# Patient Record
Sex: Female | Born: 1980 | Race: White | Hispanic: No | Marital: Single | Smoking: Current some day smoker
Health system: Southern US, Community
[De-identification: ages and names within clinical notes are randomized; demographics above are authoritative.]

## PROBLEM LIST (undated history)

## (undated) DIAGNOSIS — F329 Major depressive disorder, single episode, unspecified: Secondary | ICD-10-CM

## (undated) DIAGNOSIS — Z8619 Personal history of other infectious and parasitic diseases: Secondary | ICD-10-CM

## (undated) DIAGNOSIS — IMO0002 Reserved for concepts with insufficient information to code with codable children: Secondary | ICD-10-CM

## (undated) DIAGNOSIS — T7840XA Allergy, unspecified, initial encounter: Secondary | ICD-10-CM

## (undated) DIAGNOSIS — J45909 Unspecified asthma, uncomplicated: Secondary | ICD-10-CM

## (undated) DIAGNOSIS — F32A Depression, unspecified: Secondary | ICD-10-CM

## (undated) HISTORY — DX: Personal history of other infectious and parasitic diseases: Z86.19

## (undated) HISTORY — PX: CHOLECYSTECTOMY: SHX55

## (undated) HISTORY — DX: Depression, unspecified: F32.A

## (undated) HISTORY — DX: Reserved for concepts with insufficient information to code with codable children: IMO0002

## (undated) HISTORY — DX: Unspecified asthma, uncomplicated: J45.909

## (undated) HISTORY — DX: Major depressive disorder, single episode, unspecified: F32.9

## (undated) HISTORY — DX: Allergy, unspecified, initial encounter: T78.40XA

---

## 2004-07-31 ENCOUNTER — Emergency Department: Payer: Self-pay | Admitting: Emergency Medicine

## 2004-10-12 HISTORY — PX: GALLBLADDER SURGERY: SHX652

## 2005-10-12 HISTORY — PX: TUBAL LIGATION: SHX77

## 2005-11-10 ENCOUNTER — Inpatient Hospital Stay: Payer: Self-pay | Admitting: Obstetrics & Gynecology

## 2006-07-01 ENCOUNTER — Ambulatory Visit: Payer: Self-pay | Admitting: Chiropractic Medicine

## 2006-07-12 ENCOUNTER — Ambulatory Visit: Payer: Self-pay | Admitting: Chiropractic Medicine

## 2006-09-08 ENCOUNTER — Ambulatory Visit: Payer: Self-pay | Admitting: Internal Medicine

## 2006-10-25 ENCOUNTER — Emergency Department: Payer: Self-pay | Admitting: Unknown Physician Specialty

## 2007-10-13 DIAGNOSIS — IMO0002 Reserved for concepts with insufficient information to code with codable children: Secondary | ICD-10-CM

## 2007-10-13 HISTORY — DX: Reserved for concepts with insufficient information to code with codable children: IMO0002

## 2008-04-06 ENCOUNTER — Emergency Department: Payer: Self-pay | Admitting: Emergency Medicine

## 2008-07-06 ENCOUNTER — Inpatient Hospital Stay: Payer: Self-pay | Admitting: Psychiatry

## 2009-09-11 ENCOUNTER — Emergency Department: Payer: Self-pay | Admitting: Emergency Medicine

## 2010-02-25 ENCOUNTER — Emergency Department: Payer: Self-pay | Admitting: Emergency Medicine

## 2010-11-05 ENCOUNTER — Emergency Department: Payer: Self-pay | Admitting: Emergency Medicine

## 2011-03-30 ENCOUNTER — Emergency Department: Payer: Self-pay | Admitting: Emergency Medicine

## 2011-09-18 ENCOUNTER — Emergency Department: Payer: Self-pay | Admitting: Emergency Medicine

## 2012-05-26 ENCOUNTER — Emergency Department: Payer: Self-pay | Admitting: Emergency Medicine

## 2012-08-16 ENCOUNTER — Emergency Department: Payer: Self-pay | Admitting: Emergency Medicine

## 2012-11-28 ENCOUNTER — Emergency Department: Payer: Self-pay | Admitting: Emergency Medicine

## 2012-11-30 LAB — BETA STREP CULTURE(ARMC)

## 2014-12-16 ENCOUNTER — Emergency Department: Payer: Self-pay | Admitting: Emergency Medicine

## 2015-04-19 ENCOUNTER — Encounter: Payer: Self-pay | Admitting: Family Medicine

## 2015-04-19 ENCOUNTER — Other Ambulatory Visit: Payer: Self-pay

## 2015-04-19 ENCOUNTER — Ambulatory Visit (INDEPENDENT_AMBULATORY_CARE_PROVIDER_SITE_OTHER): Payer: Medicaid Other | Admitting: Family Medicine

## 2015-04-19 VITALS — BP 115/73 | HR 83 | Temp 99.0°F | Resp 16 | Ht 63.0 in | Wt 129.4 lb

## 2015-04-19 DIAGNOSIS — M1732 Unilateral post-traumatic osteoarthritis, left knee: Secondary | ICD-10-CM

## 2015-04-19 DIAGNOSIS — J4530 Mild persistent asthma, uncomplicated: Secondary | ICD-10-CM | POA: Insufficient documentation

## 2015-04-19 DIAGNOSIS — Z8342 Family history of familial hypercholesterolemia: Secondary | ICD-10-CM

## 2015-04-19 DIAGNOSIS — F419 Anxiety disorder, unspecified: Secondary | ICD-10-CM

## 2015-04-19 DIAGNOSIS — Z8349 Family history of other endocrine, nutritional and metabolic diseases: Secondary | ICD-10-CM | POA: Diagnosis not present

## 2015-04-19 DIAGNOSIS — F32A Depression, unspecified: Secondary | ICD-10-CM

## 2015-04-19 DIAGNOSIS — F418 Other specified anxiety disorders: Secondary | ICD-10-CM | POA: Diagnosis not present

## 2015-04-19 DIAGNOSIS — Z720 Tobacco use: Secondary | ICD-10-CM

## 2015-04-19 DIAGNOSIS — F329 Major depressive disorder, single episode, unspecified: Secondary | ICD-10-CM | POA: Diagnosis not present

## 2015-04-19 DIAGNOSIS — M1712 Unilateral primary osteoarthritis, left knee: Secondary | ICD-10-CM | POA: Insufficient documentation

## 2015-04-19 DIAGNOSIS — J302 Other seasonal allergic rhinitis: Secondary | ICD-10-CM

## 2015-04-19 MED ORDER — MELOXICAM 15 MG PO TABS: 15.0000 mg | ORAL_TABLET | Freq: Every day | ORAL | Status: AC

## 2015-04-19 MED ORDER — FLUTICASONE PROPIONATE HFA 110 MCG/ACT IN AERO: 2.0000 | INHALATION_SPRAY | Freq: Two times a day (BID) | RESPIRATORY_TRACT | Status: DC

## 2015-04-19 MED ORDER — FLUTICASONE PROPIONATE 50 MCG/ACT NA SUSP: 2.0000 | Freq: Every day | NASAL | Status: DC

## 2015-04-19 MED ORDER — ESCITALOPRAM OXALATE 10 MG PO TABS: 10.0000 mg | ORAL_TABLET | Freq: Every day | ORAL | Status: DC

## 2015-04-19 MED ORDER — ALBUTEROL SULFATE HFA 108 (90 BASE) MCG/ACT IN AERS: 2.0000 | INHALATION_SPRAY | Freq: Four times a day (QID) | RESPIRATORY_TRACT | Status: AC | PRN

## 2015-04-19 MED ORDER — CETIRIZINE HCL 10 MG PO TABS: 10.0000 mg | ORAL_TABLET | Freq: Every day | ORAL | Status: DC
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
## 2015-04-19 NOTE — Assessment & Plan Note (Signed)
Pt is not ready to quit at this time.  
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
## 2015-04-19 NOTE — Patient Instructions (Signed)
Knee: We will refer you to orthopedics and start Mobic for pain/swelling today. Please do not take any addition ibuprofen, aleve, or Goody's powder while taking this medication.   Asthma: Start controller medication 2 puffs twice daily, and use albuterol inhaler as needed. You will need spirometry in the near future to check your lungs.  Please seek immediate medical attention if you develop shortness of breath not relieve by inhaler, chest pain/tightness, fever > 103 F or other concerning symptoms.

## 2015-04-19 NOTE — Assessment & Plan Note (Signed)
Symptoms consistent with persistent asthma. Start controller medication today. PRN albuterol inhaler. Asthma alarm symptoms reviewed. Spirometry at next visit to determine asthma severity or pt report of possible COPD. Smoking cessation advised.
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
## 2015-04-19 NOTE — Assessment & Plan Note (Signed)
Start zyrtec and flonase for allergies.

## 2015-04-27 LAB — CBC WITH DIFFERENTIAL/PLATELET
BASOS ABS: 0 10*3/uL (ref 0.0–0.2)
BASOS: 1 %
EOS (ABSOLUTE): 0.1 10*3/uL (ref 0.0–0.4)
Eos: 2 %
HEMATOCRIT: 35.9 % (ref 34.0–46.6)
HEMOGLOBIN: 12.3 g/dL (ref 11.1–15.9)
Immature Grans (Abs): 0 10*3/uL (ref 0.0–0.1)
Immature Granulocytes: 0 %
Lymphocytes Absolute: 1.8 10*3/uL (ref 0.7–3.1)
Lymphs: 31 %
MCH: 34.4 pg — ABNORMAL HIGH (ref 26.6–33.0)
MCHC: 34.3 g/dL (ref 31.5–35.7)
MCV: 100 fL — ABNORMAL HIGH (ref 79–97)
MONOS ABS: 0.4 10*3/uL (ref 0.1–0.9)
Monocytes: 7 %
NEUTROS ABS: 3.5 10*3/uL (ref 1.4–7.0)
Neutrophils: 59 %
Platelets: 216 10*3/uL (ref 150–379)
RBC: 3.58 x10E6/uL — ABNORMAL LOW (ref 3.77–5.28)
RDW: 13.3 % (ref 12.3–15.4)
WBC: 5.9 10*3/uL (ref 3.4–10.8)

## 2015-04-27 LAB — COMPREHENSIVE METABOLIC PANEL
ALBUMIN: 4.5 g/dL (ref 3.5–5.5)
ALT: 8 IU/L (ref 0–32)
AST: 13 IU/L (ref 0–40)
Albumin/Globulin Ratio: 2.5 (ref 1.1–2.5)
Alkaline Phosphatase: 62 IU/L (ref 39–117)
BUN/Creatinine Ratio: 25 — ABNORMAL HIGH (ref 8–20)
BUN: 16 mg/dL (ref 6–20)
Bilirubin Total: 0.3 mg/dL (ref 0.0–1.2)
CO2: 23 mmol/L (ref 18–29)
CREATININE: 0.63 mg/dL (ref 0.57–1.00)
Calcium: 9.4 mg/dL (ref 8.7–10.2)
Chloride: 100 mmol/L (ref 97–108)
GFR, EST AFRICAN AMERICAN: 134 mL/min/{1.73_m2} (ref >59)
GFR, EST NON AFRICAN AMERICAN: 117 mL/min/{1.73_m2} (ref >59)
GLUCOSE: 100 mg/dL — AB (ref 65–99)
Globulin, Total: 1.8 g/dL (ref 1.5–4.5)
Potassium: 4.5 mmol/L (ref 3.5–5.2)
Sodium: 140 mmol/L (ref 134–144)
Total Protein: 6.3 g/dL (ref 6.0–8.5)

## 2015-04-27 LAB — LIPID PANEL
CHOLESTEROL TOTAL: 137 mg/dL (ref 100–199)
Chol/HDL Ratio: 1.8 ratio units (ref 0.0–4.4)
HDL: 77 mg/dL (ref >39)
LDL Calculated: 40 mg/dL (ref 0–99)
Triglycerides: 98 mg/dL (ref 0–149)
VLDL CHOLESTEROL CAL: 20 mg/dL (ref 5–40)

## 2015-04-29 ENCOUNTER — Telehealth: Payer: Self-pay | Admitting: Family Medicine

## 2015-04-29 NOTE — Telephone Encounter (Signed)
Called [facility] to add on B12 and folate to work up macrocytic anemia shown on last CBC.

## 2015-04-29 NOTE — Telephone Encounter (Signed)
Called pt to review labs results, LMTCB:  CMP is normal but glucose is elevated- will do a quick screen for diabetes at her next visit.  CBC: Shows low RBC and large cells. Have added on some lab tests to work-up for anemia.  Lipid panel is normal.

## 2015-04-30 LAB — B12 AND FOLATE PANEL
FOLATE: 15.7 ng/mL (ref >3.0)
VITAMIN B 12: 1562 pg/mL — AB (ref 211–946)

## 2015-04-30 NOTE — Telephone Encounter (Signed)
LMTCB

## 2015-04-30 NOTE — Telephone Encounter (Signed)
Pt.notified

## 2015-05-02 LAB — SPECIMEN STATUS REPORT

## 2015-05-17 ENCOUNTER — Encounter: Payer: Self-pay | Admitting: Family Medicine

## 2015-05-17 ENCOUNTER — Ambulatory Visit (INDEPENDENT_AMBULATORY_CARE_PROVIDER_SITE_OTHER): Payer: Medicaid Other | Admitting: Family Medicine

## 2015-05-17 VITALS — BP 115/74 | HR 94 | Temp 98.2°F | Resp 16 | Ht 63.0 in | Wt 129.6 lb

## 2015-05-17 DIAGNOSIS — F419 Anxiety disorder, unspecified: Principal | ICD-10-CM

## 2015-05-17 DIAGNOSIS — Z720 Tobacco use: Secondary | ICD-10-CM | POA: Diagnosis not present

## 2015-05-17 DIAGNOSIS — R5383 Other fatigue: Secondary | ICD-10-CM | POA: Diagnosis not present

## 2015-05-17 DIAGNOSIS — F418 Other specified anxiety disorders: Secondary | ICD-10-CM

## 2015-05-17 DIAGNOSIS — F329 Major depressive disorder, single episode, unspecified: Secondary | ICD-10-CM

## 2015-05-17 DIAGNOSIS — G43909 Migraine, unspecified, not intractable, without status migrainosus: Secondary | ICD-10-CM | POA: Insufficient documentation

## 2015-05-17 DIAGNOSIS — J4530 Mild persistent asthma, uncomplicated: Secondary | ICD-10-CM | POA: Diagnosis not present

## 2015-05-17 DIAGNOSIS — G43709 Chronic migraine without aura, not intractable, without status migrainosus: Secondary | ICD-10-CM

## 2015-05-17 DIAGNOSIS — R7309 Other abnormal glucose: Secondary | ICD-10-CM

## 2015-05-17 DIAGNOSIS — R748 Abnormal levels of other serum enzymes: Secondary | ICD-10-CM | POA: Diagnosis not present

## 2015-05-17 LAB — POCT GLYCOSYLATED HEMOGLOBIN (HGB A1C): Hemoglobin A1C: 4.8

## 2015-05-17 MED ORDER — SUMATRIPTAN SUCCINATE 50 MG PO TABS: 50.0000 mg | ORAL_TABLET | ORAL | Status: DC | PRN

## 2015-05-17 MED ORDER — MONTELUKAST SODIUM 10 MG PO TABS: 10.0000 mg | ORAL_TABLET | Freq: Every day | ORAL | Status: DC
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
## 2015-05-17 NOTE — Addendum Note (Signed)
Addended byLaroy Apple, Decorian Schuenemann L on: 05/17/2015 03:11 PM   Modules accepted: Orders

## 2015-05-17 NOTE — Assessment & Plan Note (Signed)
Recommend keeping HA diary. Trial of triptans PRN for relief- recommend no more than 2-3x per month.  Reduce caffeine.  Consider prophylaxis if conservative measures are not helpful.

## 2015-05-17 NOTE — Assessment & Plan Note (Signed)
Encouraged smoking cessation. Discussed it role in asthma. Commended pt on reducing cigarettes.  [place] [facility] reviewed.
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
## 2015-05-17 NOTE — Assessment & Plan Note (Signed)
Trial of singulair to help reduce symptoms. Encouraged smoking cessation to help reduce symptoms.  Spirometry is due but patient has declined referral for outpatient spirometry at hospital. Will do office spirometry in November when equipment is operational.

## 2015-05-17 NOTE — Patient Instructions (Addendum)
Migraines: You can take 1 imitrex as needed for migraine. If it doesn't help, you can take another 2 hours later. Please stop taking B12 and do not drink energy drinks that contain B12. To help with your energy you can take a Vitamin D3 2000IU OTC.   To help your headaches try limiting your caffeine.    Migraine Headache A migraine headache is an intense, throbbing pain on one or both sides of your head. A migraine can last for 30 minutes to several hours. CAUSES  The exact cause of a migraine headache is not always known. However, a migraine may be caused when nerves in the brain become irritated and release chemicals that cause inflammation. This causes pain. Certain things may also trigger migraines, such as:  Alcohol.  Smoking.  Stress.  Menstruation.  Aged cheeses.  Foods or drinks that contain nitrates, glutamate, aspartame, or tyramine.  Lack of sleep.  Chocolate.  Caffeine.  Hunger.  Physical exertion.  Fatigue.  Medicines used to treat chest pain (nitroglycerine), birth control pills, estrogen, and some blood pressure medicines. SIGNS AND SYMPTOMS  Pain on one or both sides of your head.  Pulsating or throbbing pain.  Severe pain that prevents daily activities.  Pain that is aggravated by any physical activity.  Nausea, vomiting, or both.  Dizziness.  Pain with exposure to bright lights, loud noises, or activity.  General sensitivity to bright lights, loud noises, or smells. Before you get a migraine, you may get warning signs that a migraine is coming (aura). An aura may include:  Seeing flashing lights.  Seeing bright spots, halos, or zigzag lines.  Having tunnel vision or blurred vision.  Having feelings of numbness or tingling.  Having trouble talking.  Having muscle weakness. DIAGNOSIS  A migraine headache is often diagnosed based on:  Symptoms.  Physical exam.  A CT scan or MRI of your head. These imaging tests cannot diagnose  migraines, but they can help rule out other causes of headaches. TREATMENT Medicines may be given for pain and nausea. Medicines can also be given to help prevent recurrent migraines.  HOME CARE INSTRUCTIONS  Only take over-the-counter or prescription medicines for pain or discomfort as directed by your health care provider. The use of long-term narcotics is not recommended.  Lie down in a dark, quiet room when you have a migraine.  Keep a journal to find out what may trigger your migraine headaches. For example, write down:  What you eat and drink.  How much sleep you get.  Any change to your diet or medicines.  Limit alcohol consumption.  Quit smoking if you smoke.  Get 7-9 hours of sleep, or as recommended by your health care provider.  Limit stress.  Keep lights dim if bright lights bother you and make your migraines worse. SEEK IMMEDIATE MEDICAL CARE IF:   Your migraine becomes severe.  You have a fever.  You have a stiff neck.  You have vision loss.  You have muscular weakness or loss of muscle control.  You start losing your balance or have trouble walking.  You feel faint or pass out.  You have severe symptoms that are different from your first symptoms. MAKE SURE YOU:   Understand these instructions.  Will watch your condition.  Will get help right away if you are not doing well or get worse. Document Released: 09/28/2005 Document Revised: 02/12/2014 Document Reviewed: 06/05/2013 [facility] Patient Information 2015 [place], [place]. This information is not intended to replace advice given  to you by your health care provider. Make sure you discuss any questions you have with your health care provider.  

## 2015-08-30 ENCOUNTER — Encounter: Payer: Medicaid Other | Admitting: Family Medicine

## 2015-09-27 ENCOUNTER — Encounter: Payer: Medicaid Other | Admitting: Family Medicine

## 2016-01-10 ENCOUNTER — Encounter: Payer: Self-pay | Admitting: Family Medicine

## 2016-01-10 ENCOUNTER — Ambulatory Visit (INDEPENDENT_AMBULATORY_CARE_PROVIDER_SITE_OTHER): Payer: Medicaid Other | Admitting: Family Medicine

## 2016-01-10 VITALS — BP 128/76 | HR 72 | Temp 98.2°F | Resp 16 | Ht 62.0 in | Wt 112.8 lb

## 2016-01-10 DIAGNOSIS — J0100 Acute maxillary sinusitis, unspecified: Secondary | ICD-10-CM | POA: Diagnosis not present

## 2016-01-10 DIAGNOSIS — J302 Other seasonal allergic rhinitis: Secondary | ICD-10-CM | POA: Diagnosis not present

## 2016-01-10 DIAGNOSIS — R630 Anorexia: Secondary | ICD-10-CM

## 2016-01-10 DIAGNOSIS — J4531 Mild persistent asthma with (acute) exacerbation: Secondary | ICD-10-CM | POA: Diagnosis not present

## 2016-01-10 MED ORDER — PREDNISONE 20 MG PO TABS: 40.0000 mg | ORAL_TABLET | Freq: Every day | ORAL | Status: DC

## 2016-01-10 MED ORDER — DOXYCYCLINE HYCLATE 100 MG PO TABS: 100.0000 mg | ORAL_TABLET | Freq: Two times a day (BID) | ORAL | Status: DC

## 2016-01-10 MED ORDER — DM-GUAIFENESIN ER 30-600 MG PO TB12: 1.0000 | ORAL_TABLET | Freq: Two times a day (BID) | ORAL | Status: DC

## 2016-01-10 MED ORDER — PSEUDOEPHEDRINE HCL 60 MG PO TABS: 60.0000 mg | ORAL_TABLET | Freq: Three times a day (TID) | ORAL | Status: DC | PRN

## 2016-01-10 NOTE — Progress Notes (Signed)
Subjective:    Patient ID: Kathy Quinn Diggs, female    DOB: 10/22/1980, 35 y.o.   MRN: [id number]  HPI: Kathy Quinn Andes is Quinn 35 y.o. female presenting on 01/10/2016 for Sinus Problem   HPI  Pt presents for possible since infection. Symptoms present for about 3 weeks. Facial pain. HA's- pain. Pressure right behind the eyes. Purulent drainage. Post nasal drip and coughing. Feeling nauseated. Poor appetite. Cough is productive- feels like she is using inhaler more frequently. Using inhaler 2-3 times per day. Coughing and wheezing at night. Low grade fevers off and on at home.   Past Medical History  Diagnosis Date  . Allergy   . Asthma   . Depression   . History of chicken pox   . Knee fracture, left 2009    Current Outpatient Prescriptions on File Prior to Visit  Medication Sig  . acetaminophen (TYLENOL) 500 MG tablet Take 500 mg by mouth every 6 (six) hours as needed.  Marland Kitchen. albuterol (PROVENTIL HFA;VENTOLIN HFA) 108 (90 BASE) MCG/ACT inhaler Inhale 2 puffs into the lungs every 6 (six) hours as needed for wheezing or shortness of breath.  . cetirizine (ZYRTEC) 10 MG tablet Take 1 tablet (10 mg total) by mouth daily.  . diphenhydrAMINE (SOMINEX) 25 MG tablet Take 25 mg by mouth at bedtime as needed for sleep.  . ferrous sulfate 325 (65 FE) MG tablet Take 325 mg by mouth daily with breakfast.  . fluticasone (FLONASE) 50 MCG/ACT nasal spray Place 2 sprays into both nostrils daily.  . fluticasone (FLOVENT HFA) 110 MCG/ACT inhaler Inhale 2 puffs into the lungs 2 (two) times daily.  . meloxicam (MOBIC) 15 MG tablet Take 1 tablet (15 mg total) by mouth daily.  . montelukast (SINGULAIR) 10 MG tablet Take 1 tablet (10 mg total) by mouth at bedtime.  . SUMAtriptan (IMITREX) 50 MG tablet Take 1 tablet (50 mg total) by mouth every 2 (two) hours as needed for migraine. May repeat in 2 hours if headache persists or recurs.   No current facility-administered medications on file prior to visit.     Review of Systems  Constitutional: Positive for fever, chills and appetite change.  HENT: Positive for congestion, postnasal drip, sinus pressure and sore throat. Negative for ear pain.   Respiratory: Positive for cough and chest tightness.   Cardiovascular: Negative for chest pain, palpitations and leg swelling.  Gastrointestinal: Positive for nausea.  Neurological: Positive for headaches.   Per HPI unless specifically indicated above     Objective:    BP 128/76 mmHg  Pulse 72  Temp(Src) 98.2 F (36.8 C) (Oral)  Resp 16  Ht 5\' 2"  (1.575 m)  Wt 112 lb 12.8 oz (51.166 kg)  BMI 20.63 kg/m2  SpO2 100%  LMP 12/27/2015 (Approximate)  Wt Readings from Last 3 Encounters:  01/10/16 112 lb 12.8 oz (51.166 kg)  05/17/15 129 lb 9.6 oz (58.786 kg)  04/19/15 129 lb 6.4 oz (58.695 kg)    Physical Exam  Constitutional: She appears well-developed and well-nourished. No distress.  HENT:  Head: Normocephalic and atraumatic.  Right Ear: Hearing and tympanic membrane normal.  Left Ear: Hearing and tympanic membrane normal.  Nose: Mucosal edema and rhinorrhea present. Right sinus exhibits frontal sinus tenderness. Left sinus exhibits frontal sinus tenderness.  Mouth/Throat: Mucous membranes are normal. Posterior oropharyngeal erythema present.  Pulmonary/Chest: Effort normal. No respiratory distress. She has wheezes (expiratory with coughing). She exhibits no tenderness.  Abdominal: Soft. Bowel sounds are normal. There  is no tenderness. There is no CVA tenderness.  Skin: She is not diaphoretic.   Results for orders placed or performed in visit on 05/17/15  POCT HgB A1C  Result Value Ref Range   Hemoglobin A1C 4.8       Assessment & Plan:   Problem List Items Addressed This Visit      Respiratory   Mild persistent asthma - Primary    Treat for exacerbation given wheezing, coughing, and increased SABA use. Prednisone  daily x 5 days. Alarm symptoms reviewed. Recheck on  breathing in 1 mos.       Relevant Medications   predniSONE (DELTASONE) 20 MG tablet   Seasonal allergies    Encouraged pt to use home flonase.       Other Visit Diagnoses    Acute maxillary sinusitis, recurrence not specified        Treat for sinusitis. Doxy BID x 7 days due to cost. Mucinex for congestion.     Relevant Medications    doxycycline (VIBRA-TABS) 100 MG tablet    predniSONE (DELTASONE) 20 MG tablet    dextromethorphan-guaiFENesin (MUCINEX DM) 30-600 MG 12hr tablet    pseudoephedrine (SUDAFED) 60 MG tablet    Poor appetite        Likely 2/2 illness. Will recheck weigth and eating in 1 mos.        Meds ordered this encounter  Medications  . doxycycline (VIBRA-TABS) 100 MG tablet    Sig: Take 1 tablet (100 mg total) by mouth 2 (two) times daily.    Dispense:  14 tablet    Refill:  0    Order Specific Question:  Supervising Provider    Answer:  Janeann Forehand [phone]  . predniSONE (DELTASONE) 20 MG tablet    Sig: Take 2 tablets (40 mg total) by mouth daily with breakfast.    Dispense:  10 tablet    Refill:  0    Order Specific Question:  Supervising Provider    Answer:  Janeann Forehand [phone]  . dextromethorphan-guaiFENesin (MUCINEX DM) 30-600 MG 12hr tablet    Sig: Take 1 tablet by mouth 2 (two) times daily.    Dispense:  20 tablet    Refill:  0    Order Specific Question:  Supervising Provider    Answer:  Janeann Forehand [phone]  . pseudoephedrine (SUDAFED) 60 MG tablet    Sig: Take 1 tablet (60 mg total) by mouth every 8 (eight) hours as needed for congestion.    Dispense:  30 tablet    Refill:  0    Order Specific Question:  Supervising Provider    Answer:  Janeann Forehand [[phone]]      Follow up plan: Return in about 4 weeks (around 02/07/2016) for asthma, weight check.

## 2016-01-10 NOTE — Assessment & Plan Note (Signed)
Encouraged pt to use home flonase.

## 2016-01-10 NOTE — Patient Instructions (Signed)
You can use supportive care at home to help with your symptoms. I have sent Mucinex DM to your pharmacy to help break up the congestion and soothe your cough. You can takes this twice daily.  Honey is a natural cough suppressant- so add it to your tea in the morning.  If you have a humidifer, set that up in your bedroom at night.   Please seek immediate medical attention if you develop shortness of breath not relieve by inhaler, chest pain/tightness, fever > 103 F or other concerning symptoms.    

## 2016-01-10 NOTE — Assessment & Plan Note (Addendum)
Treat for exacerbation given wheezing, coughing, and increased SABA use. Prednisone 40mg  daily x 5 days. Alarm symptoms reviewed. Recheck on breathing in 1 mos.

## 2016-03-11 ENCOUNTER — Emergency Department: Payer: Self-pay

## 2016-03-11 ENCOUNTER — Emergency Department
Admission: EM | Admit: 2016-03-11 | Discharge: 2016-03-11 | Disposition: A | Discharge status: Home / Self Care | Payer: Self-pay | Attending: Emergency Medicine | Admitting: Emergency Medicine

## 2016-03-11 ENCOUNTER — Encounter: Payer: Self-pay | Admitting: Emergency Medicine

## 2016-03-11 DIAGNOSIS — J4530 Mild persistent asthma, uncomplicated: Secondary | ICD-10-CM | POA: Insufficient documentation

## 2016-03-11 DIAGNOSIS — R1031 Right lower quadrant pain: Secondary | ICD-10-CM | POA: Insufficient documentation

## 2016-03-11 DIAGNOSIS — F329 Major depressive disorder, single episode, unspecified: Secondary | ICD-10-CM | POA: Insufficient documentation

## 2016-03-11 DIAGNOSIS — Z79899 Other long term (current) drug therapy: Secondary | ICD-10-CM | POA: Insufficient documentation

## 2016-03-11 DIAGNOSIS — M179 Osteoarthritis of knee, unspecified: Secondary | ICD-10-CM | POA: Insufficient documentation

## 2016-03-11 DIAGNOSIS — Z7951 Long term (current) use of inhaled steroids: Secondary | ICD-10-CM | POA: Insufficient documentation

## 2016-03-11 DIAGNOSIS — Z87891 Personal history of nicotine dependence: Secondary | ICD-10-CM | POA: Insufficient documentation

## 2016-03-11 LAB — COMPREHENSIVE METABOLIC PANEL
ALBUMIN: 4.9 g/dL (ref 3.5–5.0)
ALK PHOS: 87 U/L (ref 38–126)
ALT: 17 U/L (ref 14–54)
AST: 25 U/L (ref 15–41)
Anion gap: 11 (ref 5–15)
BILIRUBIN TOTAL: 0.7 mg/dL (ref 0.3–1.2)
BUN: 13 mg/dL (ref 6–20)
CALCIUM: 9.2 mg/dL (ref 8.9–10.3)
CO2: 25 mmol/L (ref 22–32)
CREATININE: 0.58 mg/dL (ref 0.44–1.00)
Chloride: 103 mmol/L (ref 101–111)
GFR calc Af Amer: >60 mL/min (ref >60)
GFR calc non Af Amer: >60 mL/min (ref >60)
GLUCOSE: 82 mg/dL (ref 65–99)
Potassium: 4 mmol/L (ref 3.5–5.1)
SODIUM: 139 mmol/L (ref 135–145)
TOTAL PROTEIN: 7.6 g/dL (ref 6.5–8.1)

## 2016-03-11 LAB — CHLAMYDIA/NGC RT PCR (ARMC ONLY)
CHLAMYDIA TR: NOT DETECTED
N GONORRHOEAE: NOT DETECTED

## 2016-03-11 LAB — URINALYSIS COMPLETE WITH MICROSCOPIC (ARMC ONLY)
Bacteria, UA: NONE SEEN
Bilirubin Urine: NEGATIVE
GLUCOSE, UA: NEGATIVE mg/dL
Hgb urine dipstick: NEGATIVE
KETONES UR: NEGATIVE mg/dL
Leukocytes, UA: NEGATIVE
NITRITE: NEGATIVE
PROTEIN: NEGATIVE mg/dL
Specific Gravity, Urine: 1.017 (ref 1.005–1.030)
pH: 5 (ref 5.0–8.0)

## 2016-03-11 LAB — CBC
HCT: 43.8 % (ref 35.0–47.0)
HEMOGLOBIN: 15 g/dL (ref 12.0–16.0)
MCH: 34 pg (ref 26.0–34.0)
MCHC: 34.3 g/dL (ref 32.0–36.0)
MCV: 99.1 fL (ref 80.0–100.0)
PLATELETS: 239 10*3/uL (ref 150–440)
RBC: 4.42 MIL/uL (ref 3.80–5.20)
RDW: 13.2 % (ref 11.5–14.5)
WBC: 6.2 10*3/uL (ref 3.6–11.0)

## 2016-03-11 LAB — WET PREP, GENITAL
CLUE CELLS WET PREP: NONE SEEN
Sperm: NONE SEEN
Trich, Wet Prep: NONE SEEN
WBC, Wet Prep HPF POC: NONE SEEN
YEAST WET PREP: NONE SEEN

## 2016-03-11 LAB — HCG, QUANTITATIVE, PREGNANCY: hCG, Beta Chain, Quant, S: <1 m[IU]/mL (ref <5)

## 2016-03-11 LAB — LIPASE, BLOOD: Lipase: 20 U/L (ref 11–51)

## 2016-03-11 MED ORDER — MORPHINE SULFATE (PF) 4 MG/ML IV SOLN
4.0000 mg | Freq: Once | INTRAVENOUS | Status: AC
  Administered 2016-03-11: 4 mg via INTRAVENOUS
  Filled 2016-03-11: qty 1

## 2016-03-11 MED ORDER — ONDANSETRON 4 MG PO TBDP: 4.0000 mg | ORAL_TABLET | Freq: Four times a day (QID) | ORAL | Status: DC | PRN

## 2016-03-11 MED ORDER — SODIUM CHLORIDE 0.9 % IV BOLUS (SEPSIS)
1000.0000 mL | Freq: Once | INTRAVENOUS | Status: AC
  Administered 2016-03-11: 1000 mL via INTRAVENOUS

## 2016-03-11 MED ORDER — IOPAMIDOL (ISOVUE-300) INJECTION 61%
100.0000 mL | Freq: Once | INTRAVENOUS | Status: AC | PRN
  Administered 2016-03-11: 100 mL via INTRAVENOUS

## 2016-03-11 MED ORDER — DIATRIZOATE MEGLUMINE & SODIUM 66-10 % PO SOLN
15.0000 mL | Freq: Once | ORAL | Status: AC
  Administered 2016-03-11: 30 mL via ORAL

## 2016-03-11 MED ORDER — ONDANSETRON HCL 4 MG/2ML IJ SOLN
4.0000 mg | Freq: Once | INTRAMUSCULAR | Status: AC
  Administered 2016-03-11: 4 mg via INTRAVENOUS
  Filled 2016-03-11: qty 2

## 2016-03-11 NOTE — ED Notes (Signed)
Pt woke up Sunday morning "vomiting blood" x1.  Pt states n/v continued but not bloody emesis.  Pt endorses abdominal pain since approx 1 week ago.  Decreased appetite, but able to keep some water down.  Pt in NAD.

## 2016-03-11 NOTE — ED Provider Notes (Signed)
-----------------------------------------   5:44 PM on 03/11/2016 -----------------------------------------   Blood pressure 119/81, pulse 90, temperature 98.4 F (36.9 C), temperature source Oral, resp. rate 18, height 5\' 2"  (1.575 m), weight 115 lb (52.164 kg), last menstrual period 02/24/2016, SpO2 98 %.  Assuming care from Dr. Fanny BienQuale.  In short, Kathy Quinn is a 35 y.o. female with a chief complaint of Nausea; Emesis; and Abdominal Pain .  Refer to the original H&P for additional details.  The current plan of care is to follow the results of the patient's abdominal pelvic CT  Narrative:    CLINICAL DATA: Right lower quadrant pain with nausea and vomiting.  EXAM: CT ABDOMEN AND PELVIS WITH CONTRAST  TECHNIQUE: Multidetector CT imaging of the abdomen and pelvis was performed using the standard protocol following bolus administration of intravenous contrast.  CONTRAST: 100mL ISOVUE-300 IOPAMIDOL (ISOVUE-300) INJECTION 61%  COMPARISON: Ultrasound of the pelvis from earlier today  FINDINGS: There is a 3 mm nodule posteriorly in the right lung on series 4, image 8 of doubtful significance. A 4 mm nodule is seen in the left lung on image 5. The lung bases are otherwise normal. No free air or free fluid. Intra and extrahepatic biliary duct dilatation is likely due to previous cholecystectomy with no filling defects identified in the common bile duct. The liver, portal vein, and adrenal glands are normal. Granulomas are seen in the spleen. The pancreatic duct is borderline measuring 2 mm. No pancreatic mass or peripancreatic stranding is identified. The kidneys are normal. The abdominal aorta is normal in caliber. No adenopathy. The stomach and small bowel are within normal limits. The colon and appendix are normal.  The uterus and left ovary are normal. The right ovary is larger than the left with ring-like enhancement measuring up to 1.8 cm laterally. The bladder is  normal. No adenopathy.  The visualized bones are normal.  IMPRESSION: 1. There is ring enhancement in the right ovary, likely a corpus luteum cyst. 2. The appendix is normal in appearance. No appendicitis identified. 3. Intra and extrahepatic biliary duct dilatation, likely due to previous cholecystectomy. Recommend correlation with labs. 4. Pulmonary nodules as described above with the largest measuring 4 mm on the left. See below for follow-up recommendations. No follow-up needed if patient is low-risk. Non-contrast chest CT can be considered in 12 months if patient is high-risk. This recommendation follows the consensus statement: Guidelines for Management of Incidental Pulmonary Nodules Detected on CT Images:From the Fleischner Society 2017; published online before print (10.1148/[id number]).   Electronically Signed By: Gerome Samavid Williams III M.D On: 03/11/2016 17:34   The patient's results are reviewed with her at the bedside and she does not appear to have a surgical abdomen. We discussed ovarian cyst along with the pulmonary nodule which will need to be rescanned in approximately a year. Patient otherwise feels improved and was advised take over-the-counter pain medication was discharged with prescription for Zofran.  Jennye MoccasinBrian S Blossom Crume, MD 03/11/16 [phone]

## 2016-03-11 NOTE — Discharge Instructions (Signed)
You were seen in the emergency room for abdominal pain. It is important that you follow up closely with your primary care doctor in the next couple of days. I recommend you start taking medication for stomach acid such as over-the-counter Nexium as directed on the packaging. Please follow-up with your primary care doctor as well as gastroenterology.  If you're unable to see her primary care doctor you may return to the emergency room or go to the [facility] in 1 or 2 days for reexam.  Please return to the emergency room right away if you are to develop a fever, severe nausea, your pain becomes severe or worsens, you are unable to keep food down, begin vomiting any dark or bloody fluid, you develop any dark or bloody stools, feel dehydrated, or other new concerns or symptoms arise.   Abdominal Pain, Adult Many things can cause abdominal pain. Usually, abdominal pain is not caused by a disease and will improve without treatment. It can often be observed and treated at home. Your health care provider will do a physical exam and possibly order blood tests and X-rays to help determine the seriousness of your pain. However, in many cases, more time must pass before a clear cause of the pain can be found. Before that point, your health care provider may not know if you need more testing or further treatment. HOME CARE INSTRUCTIONS Monitor your abdominal pain for any changes. The following actions may help to alleviate any discomfort you are experiencing:  Only take over-the-counter or prescription medicines as directed by your health care provider.  Do not take laxatives unless directed to do so by your health care provider.  Try a clear liquid diet (broth, tea, or water) as directed by your health care provider. Slowly move to a bland diet as tolerated. SEEK MEDICAL CARE IF:  You have unexplained abdominal pain.  You have abdominal pain associated with nausea or diarrhea.  You have pain  when you urinate or have a bowel movement.  You experience abdominal pain that wakes you in the night.  You have abdominal pain that is worsened or improved by eating food.  You have abdominal pain that is worsened with eating fatty foods.  You have a fever. SEEK IMMEDIATE MEDICAL CARE IF:  Your pain does not go away within 2 hours.  You keep throwing up (vomiting).  Your pain is felt only in portions of the abdomen, such as the right side or the left lower portion of the abdomen.  You pass bloody or black tarry stools. MAKE SURE YOU:  Understand these instructions.  Will watch your condition.  Will get help right away if you are not doing well or get worse.   This information is not intended to replace advice given to you by your health care provider. Make sure you discuss any questions you have with your health care provider.   Document Released: 07/08/2005 Document Revised: 06/19/2015 Document Reviewed: 06/07/2013 Elsevier Interactive Patient Education 2016 [employer]  Abdominal Pain, Adult Many things can cause abdominal pain. Usually, abdominal pain is not caused by a disease and will improve without treatment. It can often be observed and treated at home. Your health care provider will do a physical exam and possibly order blood tests and X-rays to help determine the seriousness of your pain. However, in many cases, more time must pass before a clear cause of the pain can be found. Before that point, your health care provider may not  know if you need more testing or further treatment. HOME CARE INSTRUCTIONS Monitor your abdominal pain for any changes. The following actions may help to alleviate any discomfort you are experiencing:  Only take over-the-counter or prescription medicines as directed by your health care provider.  Do not take laxatives unless directed to do so by your health care provider.  Try a clear liquid diet (broth, tea, or water) as directed by  your health care provider. Slowly move to a bland diet as tolerated. SEEK MEDICAL CARE IF:  You have unexplained abdominal pain.  You have abdominal pain associated with nausea or diarrhea.  You have pain when you urinate or have a bowel movement.  You experience abdominal pain that wakes you in the night.  You have abdominal pain that is worsened or improved by eating food.  You have abdominal pain that is worsened with eating fatty foods.  You have a fever. SEEK IMMEDIATE MEDICAL CARE IF:  Your pain does not go away within 2 hours.  You keep throwing up (vomiting).  Your pain is felt only in portions of the abdomen, such as the right side or the left lower portion of the abdomen.  You pass bloody or black tarry stools. MAKE SURE YOU:  Understand these instructions.  Will watch your condition.  Will get help right away if you are not doing well or get worse.   This information is not intended to replace advice given to you by your health care provider. Make sure you discuss any questions you have with your health care provider.   Document Released: 07/08/2005 Document Revised: 06/19/2015 Document Reviewed: 06/07/2013 Elsevier Interactive Patient Education [employer]

## 2016-03-11 NOTE — ED Provider Notes (Signed)
[facility] Provider Note  ____________________________________________  Time seen: Approximately 1:24 PM  I have reviewed the triage vital signs and the nursing notes.   HISTORY  Chief Complaint Nausea; Emesis; and Abdominal Pain    HPI Kathy Quinn is a 35 y.o. female reports on Sunday morning she is started developing Quinn right sided abdominal discomfort. She reports that radiates somewhat to her back and is located fairly deep in the right lower abdomen. She woke up and had pain, and she reports that caused her to vomit and that she vomited what she believed was a small amount of blood. She continued to have vomiting a few times through the day, then felt better. She is able to go to work at work yesterday she reports she began having recurrence of fairly severe right lower abdominal pain, again inducing her to vomit about 7 times but no blood in it. Denies any black stool or blood in her stool. She currently reports mild to moderate pain in the right lower abdomen that radiates somewhat towards her right lower back.  She's never had pain like this before. She reports she has been able to eat, but very little for the last day. She reports that she's had Quinn intermittent pain in the right lower abdomen for the last few days at the present time it slightly better, but last night was fairly severe to the point she was throwing up.  She has not had fevers chills chest pain or trouble breathing. She's not had a pain in the right upper abdomen, and reports her gallbladder was removed several years ago. No pain in the left. No vaginal discharge, bleeding. Does report it start to tell if the pains in her abdomen or her lower pelvis.  She's had a previous tubal ligation.   Past Medical History  Diagnosis Date  . Allergy   . Asthma   . Depression   . History of chicken pox   . Knee fracture, left 2009    Patient Active Problem List   Diagnosis Date Noted  . Migraines 05/17/2015  . Anxiety and depression 04/19/2015  . Mild persistent asthma 04/19/2015  . Osteoarthritis of left knee 04/19/2015  . Seasonal allergies 04/19/2015  . Tobacco use 04/19/2015    Past Surgical History  Procedure Laterality Date  . Gallbladder surgery  2006  . Tubal ligation  2007  . Cesarean section  2007    Current Outpatient Rx  Name  Route  Sig  Dispense  Refill  . acetaminophen (TYLENOL) 500 MG tablet   Oral   Take 500 mg by mouth every 6 (six) hours as needed.         Marland Kitchen. albuterol (PROVENTIL HFA;VENTOLIN HFA) 108 (90 BASE) MCG/ACT inhaler   Inhalation   Inhale 2 puffs into the lungs every 6 (six) hours as needed for wheezing or shortness of breath.   1 Inhaler   0   . cetirizine (ZYRTEC) 10 MG tablet   Oral   Take 1 tablet (10 mg total) by mouth daily.   30 tablet   11   . dextromethorphan-guaiFENesin (MUCINEX DM) 30-600 MG 12hr tablet   Oral   Take 1 tablet by mouth 2 (two) times daily.   20 tablet   0   . diphenhydrAMINE (SOMINEX) 25 MG tablet   Oral   Take 25 mg by mouth at bedtime as needed for sleep.         Marland Kitchen. doxycycline (VIBRA-TABS)  100 MG tablet   Oral   Take 1 tablet (100 mg total) by mouth 2 (two) times daily.   14 tablet   0   . ferrous sulfate 325 (65 FE) MG tablet   Oral   Take 325 mg by mouth daily with breakfast.         . fluticasone (FLONASE) 50 MCG/ACT nasal spray   Each Nare   Place 2 sprays into both nostrils daily.   16 g   11   . fluticasone (FLOVENT HFA) 110 MCG/ACT inhaler   Inhalation   Inhale 2 puffs into the lungs 2 (two) times daily.   1 Inhaler   12   . meloxicam (MOBIC) 15 MG tablet   Oral   Take 1 tablet (15 mg total) by mouth daily.   30 tablet   1   . montelukast (SINGULAIR) 10 MG tablet   Oral   Take 1 tablet (10 mg total) by mouth at bedtime.   30 tablet   3   . ondansetron (ZOFRAN ODT) 4 MG disintegrating tablet   Oral   Take 1 tablet (4 mg total)  by mouth every 6 (six) hours as needed for nausea or vomiting.   20 tablet   0   . predniSONE (DELTASONE) 20 MG tablet   Oral   Take 2 tablets (40 mg total) by mouth daily with breakfast.   10 tablet   0   . pseudoephedrine (SUDAFED) 60 MG tablet   Oral   Take 1 tablet (60 mg total) by mouth every 8 (eight) hours as needed for congestion.   30 tablet   0   . SUMAtriptan (IMITREX) 50 MG tablet   Oral   Take 1 tablet (50 mg total) by mouth every 2 (two) hours as needed for migraine. May repeat in 2 hours if headache persists or recurs.   10 tablet   0     Allergies Review of patient's allergies indicates no known allergies.  Family History  Problem Relation Age of Onset  . Depression Mother   . Cancer Mother 40    skin  . Bipolar disorder Maternal Grandmother   . Psychiatric Illness Maternal Grandmother   . Cancer Maternal Grandmother   . Cancer Maternal Grandfather   . Cancer Paternal Grandmother   . Cancer Paternal Grandfather     Social History Social History  Substance Use Topics  . Smoking status: Former Smoker -- 0.00 packs/day for 20 years    Types: E-cigarettes    Quit date: 11/09/2015  . Smokeless tobacco: None  . Alcohol Use: None    Review of Systems Constitutional: No fever/chills Eyes: No visual changes. ENT: No sore throat. Cardiovascular: Denies chest pain. Respiratory: Denies shortness of breath. Gastrointestinal: No diarrhea.  No constipation. All bowel movement yesterday. Genitourinary: Negative for dysuria. Musculoskeletal: Negative for back pain. Skin: Negative for rash. Neurological: Negative for headaches, focal weakness or numbness.  10-point ROS otherwise negative.  ____________________________________________   PHYSICAL EXAM:  VITAL SIGNS: ED Triage Vitals  Enc Vitals Group     BP 03/11/16 1125 129/83 mmHg     Pulse Rate 03/11/16 1125 112     Resp 03/11/16 1125 20     Temp 03/11/16 1125 98.4 F (36.9 C)     Temp  Source 03/11/16 1125 Oral     SpO2 03/11/16 1125 100 %     Weight 03/11/16 1125 115 lb (52.164 kg)     Height 03/11/16 1125 5'  2" (1.575 m)     Head Cir --      Peak Flow --      Pain Score --      Pain Loc --      Pain Edu? --      Excl. in GC? --    Constitutional: Alert and oriented. Well appearing and in no acute distressBut does appear mildly uncomfortable in the bed. Eyes: Conjunctivae are normal. PERRL. EOMI. Head: Atraumatic. Nose: No congestion/rhinnorhea. Mouth/Throat: Mucous membranes are moist.  Oropharynx non-erythematous. Neck: No stridor.   Cardiovascular: Normal rate, regular rhythm. Grossly normal heart sounds.  Good peripheral circulation. Respiratory: Normal respiratory effort.  No retractions. Lungs CTAB. Gastrointestinal: Soft but moderate tenderness primarily located in the right lower quadrant without rebound guarding or frank peritonitis. Pain is located approximately in the region of McBurney's point. . No distention. No abdominal bruits. No CVA tenderness. Negative Rovsing. Negative Murphy. No left-sided abdominal pain. GYN: Escorted by nurse Zollie Scale. Normal external and internal exam. No cervical motion tenderness. No purulence or discharge noted. There is minimal discomfort in the right adnexa without mass noted. No tenderness on the left. Musculoskeletal: No lower extremity tenderness nor edema.  No joint effusions. Neurologic:  Normal speech and language. No gross focal neurologic deficits are appreciated.Skin:  Skin is warm, dry and intact. No rash noted. Psychiatric: Mood and affect are normal. Speech and behavior are normal.  ____________________________________________   LABS (all labs ordered are listed, but only abnormal results are displayed)  Labs Reviewed  URINALYSIS COMPLETEWITH MICROSCOPIC ([facility] ONLY) - Abnormal; Notable for the following:    Color, Urine YELLOW (*)    APPearance CLOUDY (*)    Squamous Epithelial / LPF 6-30 (*)    All other  components within normal limits  WET PREP, GENITAL  CHLAMYDIA/NGC RT PCR ([facility] ONLY)  LIPASE, BLOOD  COMPREHENSIVE METABOLIC PANEL  CBC  HCG, QUANTITATIVE, PREGNANCY   ____________________________________________  EKG   ____________________________________________  RADIOLOGY   US Transvaginal Non-OB (Final result) Result time: 03/11/16 15:17:02   Final result by Rad Results In Interface (03/11/16 15:17:02)   Narrative:   CLINICAL DATA: Right lower quadrant pain intermittently 4 days. Quantitative beta HCG less than 1.  EXAM: TRANSABDOMINAL AND TRANSVAGINAL ULTRASOUND OF PELVIS  DOPPLER ULTRASOUND OF OVARIES  TECHNIQUE: Both transabdominal and transvaginal ultrasound examinations of the pelvis were performed. Transabdominal technique was performed for global imaging of the pelvis including uterus, ovaries, adnexal regions, and pelvic cul-de-sac.  It was necessary to proceed with endovaginal exam following the transabdominal exam to visualize the endometrium and ovaries. Color and duplex Doppler ultrasound was utilized to evaluate blood flow to the ovaries.  COMPARISON: 11/05/2010  FINDINGS: Uterus  Measurements: 3.5 x 4.4 x 9.2 cm. No fibroids or other mass visualized.  Endometrium  Thickness: 4.1 mm. No focal abnormality visualized.  Right ovary  Measurements: 2.1 x 3.8 x 3.2 cm. Normal appearance/no adnexal mass.  Left ovary  Measurements: 1.9 x 2.1 x 3.0 cm. Normal appearance/no adnexal mass.  Pulsed Doppler evaluation of both ovaries demonstrates normal low-resistance arterial and venous waveforms.  Other findings  No abnormal free fluid.  IMPRESSION: Normal pelvic ultrasound.   Electronically Signed By: Elberta Fortis M.D. On: 03/11/2016 15:17          US Pelvis Complete (Final result) Result time: 03/11/16 15:17:02   Final result by Rad Results In Interface (03/11/16 15:17:02)   Narrative:   CLINICAL DATA:  Right lower quadrant pain intermittently 4 days.  Quantitative beta HCG less than 1.  EXAM: TRANSABDOMINAL AND TRANSVAGINAL ULTRASOUND OF PELVIS  DOPPLER ULTRASOUND OF OVARIES  TECHNIQUE: Both transabdominal and transvaginal ultrasound examinations of the pelvis were performed. Transabdominal technique was performed for global imaging of the pelvis including uterus, ovaries, adnexal regions, and pelvic cul-de-sac.  It was necessary to proceed with endovaginal exam following the transabdominal exam to visualize the endometrium and ovaries. Color and duplex Doppler ultrasound was utilized to evaluate blood flow to the ovaries.  COMPARISON: 11/05/2010  FINDINGS: Uterus  Measurements: 3.5 x 4.4 x 9.2 cm. No fibroids or other mass visualized.  Endometrium  Thickness: 4.1 mm. No focal abnormality visualized.  Right ovary  Measurements: 2.1 x 3.8 x 3.2 cm. Normal appearance/no adnexal mass.  Left ovary  Measurements: 1.9 x 2.1 x 3.0 cm. Normal appearance/no adnexal mass.  Pulsed Doppler evaluation of both ovaries demonstrates normal low-resistance arterial and venous waveforms.  Other findings  No abnormal free fluid.  IMPRESSION: Normal pelvic ultrasound.   Electronically Signed By: Elberta Fortis M.D. On: 03/11/2016 15:17          [place] Art/Ven Flow Abd Pelv Doppler (Final result) Result time: 03/11/16 15:17:02   Final result by Rad Results In Interface (03/11/16 15:17:02)   Narrative:   CLINICAL DATA: Right lower quadrant pain intermittently 4 days. Quantitative beta HCG less than 1.  EXAM: TRANSABDOMINAL AND TRANSVAGINAL ULTRASOUND OF PELVIS  DOPPLER ULTRASOUND OF OVARIES  TECHNIQUE: Both transabdominal and transvaginal ultrasound examinations of the pelvis were performed. Transabdominal technique was performed for global imaging of the pelvis including uterus, ovaries, adnexal regions, and pelvic cul-de-sac.  It was necessary to proceed  with endovaginal exam following the transabdominal exam to visualize the endometrium and ovaries. Color and duplex Doppler ultrasound was utilized to evaluate blood flow to the ovaries.  COMPARISON: 11/05/2010  FINDINGS: Uterus  Measurements: 3.5 x 4.4 x 9.2 cm. No fibroids or other mass visualized.  Endometrium  Thickness: 4.1 mm. No focal abnormality visualized.  Right ovary  Measurements: 2.1 x 3.8 x 3.2 cm. Normal appearance/no adnexal mass.  Left ovary  Measurements: 1.9 x 2.1 x 3.0 cm. Normal appearance/no adnexal mass.  Pulsed Doppler evaluation of both ovaries demonstrates normal low-resistance arterial and venous waveforms.  Other findings  No abnormal free fluid.  IMPRESSION: Normal pelvic ultrasound.   Electronically Signed By: Elberta Fortis M.D. On: 03/11/2016 15:17       ____________________________________________   PROCEDURES  Procedure(s) performed: None  Critical Care performed: No  ____________________________________________   INITIAL IMPRESSION / ASSESSMENT AND PLAN / ED COURSE  Pertinent labs & imaging results that were available during my care of the patient were reviewed by me and considered in my medical decision making (see chart for details).    Right lower quadrant abdominal pain, possibly pelvic discomfort off and on for about the last 4 days. The patient did report one episode of blood with emesis, however this has resolved but continued to have vomiting yesterday with associated right-sided pain. Afebrile, in no acute distress, with a relatively reassuring abdominal exam except for Quinn focal tenderness in the right lower abdomen versus pelvis. She is not pregnant, she status post cholecystectomy, her lab work is quite reassuring. However, given the intermittent nature and deep right lower abdominal discomfort we'll proceed with ultrasound to evaluate for ovarian process, exclude torsion, and if no clear etiology noted  there likely proceed with CT to evaluate for possible appendicitis or other acute cause such as renal stone, etc.  -----------------------------------------  4:06 PM on 03/11/2016 -----------------------------------------  Patient reports pain and symptoms improving. She does still have mild discomfort in the right lower abdomen, in given no evidence of gynecologic etiology and associated symptomatology of right lower quadrant pain we'll proceed with CT abdomen to evaluate for appendicitis. Ongoing care and disposition assigned to Dr. Huel Cote, follow up on CT abdomen and pelvis. Disposition likely based on CT findings as patient is felt stable for discharge if no evidence of acute intra-abdominal etiology. She has been able to tolerate oral contrast well, pain is improved and she is resting comfortably at this time. ____________________________________________   FINAL CLINICAL IMPRESSION(S) / ED DIAGNOSES  Final diagnoses:  RLQ abdominal pain      Sharyn Creamer, MD 03/11/16 [phone]

## 2016-03-11 NOTE — ED Notes (Signed)
Right flank pain that began and RLQ pain a few days ago and today has radiated around to back.

## 2016-03-12 LAB — POCT PREGNANCY, URINE: PREG TEST UR: NEGATIVE

## 2016-03-17 ENCOUNTER — Encounter: Payer: Self-pay | Admitting: Family Medicine

## 2016-03-17 ENCOUNTER — Ambulatory Visit: Payer: Medicaid Other | Admitting: Family Medicine

## 2016-05-26 ENCOUNTER — Emergency Department
Admission: EM | Admit: 2016-05-26 | Discharge: 2016-05-27 | Disposition: A | Discharge status: Home / Self Care | Payer: Self-pay | Attending: Emergency Medicine | Admitting: Emergency Medicine

## 2016-05-26 DIAGNOSIS — J45909 Unspecified asthma, uncomplicated: Secondary | ICD-10-CM | POA: Insufficient documentation

## 2016-05-26 DIAGNOSIS — Y999 Unspecified external cause status: Secondary | ICD-10-CM | POA: Insufficient documentation

## 2016-05-26 DIAGNOSIS — S61411A Laceration without foreign body of right hand, initial encounter: Secondary | ICD-10-CM

## 2016-05-26 DIAGNOSIS — W228XXA Striking against or struck by other objects, initial encounter: Secondary | ICD-10-CM | POA: Insufficient documentation

## 2016-05-26 DIAGNOSIS — S61511A Laceration without foreign body of right wrist, initial encounter: Secondary | ICD-10-CM | POA: Insufficient documentation

## 2016-05-26 DIAGNOSIS — Z23 Encounter for immunization: Secondary | ICD-10-CM | POA: Insufficient documentation

## 2016-05-26 DIAGNOSIS — Y92002 Bathroom of unspecified non-institutional (private) residence single-family (private) house as the place of occurrence of the external cause: Secondary | ICD-10-CM | POA: Insufficient documentation

## 2016-05-26 DIAGNOSIS — Y939 Activity, unspecified: Secondary | ICD-10-CM | POA: Insufficient documentation

## 2016-05-26 DIAGNOSIS — Z87891 Personal history of nicotine dependence: Secondary | ICD-10-CM | POA: Insufficient documentation

## 2016-05-26 MED ORDER — TETANUS-DIPHTH-ACELL PERTUSSIS 5-2.5-18.5 LF-MCG/0.5 IM SUSP
0.5000 mL | Freq: Once | INTRAMUSCULAR | Status: AC
  Administered 2016-05-26: 0.5 mL via INTRAMUSCULAR
  Filled 2016-05-26: qty 0.5

## 2016-05-26 NOTE — Discharge Instructions (Signed)
Please keep laceration site clean and dry. Follow-up in 7-8 days for suture removal.

## 2016-05-26 NOTE — ED Provider Notes (Signed)
[facility]-EMERGENCY DEPARTMENT Provider Note   CSN: [id number] Arrival date & time: 05/26/16  2255     History   Chief Complaint No chief complaint on file.   HPI Kathy Quinn is a 35 y.o. female presents today for evaluation of laceration to her right hand. Just prior to arrival, patient slipped in the bathroom, hit her right wrist along the countertop. She suffered a laceration to the dorsal aspect of the right wrist. She denies any numbness or tingling. No loss of digit range of motion. She denies any other injury to her body. No head trauma, nausea or vomiting, loss of consciousness. Tetanus is not up-to-date. Pain is moderate. She has not had any medications for pain  HPI    Past Medical History:  Diagnosis Date  . Allergy   . Asthma   . Depression   . History of chicken pox   . Knee fracture, left 2009    Patient Active Problem List   Diagnosis Date Noted  . Migraines 05/17/2015  . Anxiety and depression 04/19/2015  . Mild persistent asthma 04/19/2015  . Osteoarthritis of left knee 04/19/2015  . Seasonal allergies 04/19/2015  . Tobacco use 04/19/2015    Past Surgical History:  Procedure Laterality Date  . CESAREAN SECTION  2007  . GALLBLADDER SURGERY  2006  . TUBAL LIGATION  2007    OB History    No data available       Home Medications    Prior to Admission medications   Medication Sig Start Date End Date Taking? Authorizing Provider  acetaminophen (TYLENOL) 500 MG tablet Take 500 mg by mouth every 6 (six) hours as needed.    Historical Provider, MD  albuterol (PROVENTIL HFA;VENTOLIN HFA) 108 (90 BASE) MCG/ACT inhaler Inhale 2 puffs into the lungs every 6 (six) hours as needed for wheezing or shortness of breath. 04/19/15   Amy Rusty Aus, NP  cetirizine (ZYRTEC) 10 MG tablet Take 1 tablet (10 mg total) by mouth daily. 04/19/15   Amy Rusty Aus, NP  dextromethorphan-guaiFENesin (MUCINEX DM) 30-600 MG 12hr tablet Take 1 tablet by mouth 2 (two) times  daily. 01/10/16   Amy Rusty Aus, NP  diphenhydrAMINE (SOMINEX) 25 MG tablet Take 25 mg by mouth at bedtime as needed for sleep.    Historical Provider, MD  doxycycline (VIBRA-TABS) 100 MG tablet Take 1 tablet (100 mg total) by mouth 2 (two) times daily. 01/10/16   Amy Rusty Aus, NP  ferrous sulfate 325 (65 FE) MG tablet Take 325 mg by mouth daily with breakfast.    Historical Provider, MD  fluticasone (FLONASE) 50 MCG/ACT nasal spray Place 2 sprays into both nostrils daily. 04/19/15   Amy Lauren Krebs, NP  fluticasone (FLOVENT HFA) 110 MCG/ACT inhaler Inhale 2 puffs into the lungs 2 (two) times daily. 04/19/15   Amy Rusty Aus, NP  meloxicam (MOBIC) 15 MG tablet Take 1 tablet (15 mg total) by mouth daily. 04/19/15   Amy Lauren Krebs, NP  montelukast (SINGULAIR) 10 MG tablet Take 1 tablet (10 mg total) by mouth at bedtime. 05/17/15   Amy Rusty Aus, NP  ondansetron (ZOFRAN ODT) 4 MG disintegrating tablet Take 1 tablet (4 mg total) by mouth every 6 (six) hours as needed for nausea or vomiting. 03/11/16   Sharyn Creamer, MD  predniSONE (DELTASONE) 20 MG tablet Take 2 tablets (40 mg total) by mouth daily with breakfast. 01/10/16   Amy Rusty Aus, NP  pseudoephedrine (SUDAFED) 60 MG tablet Take 1 tablet (60  mg total) by mouth every 8 (eight) hours as needed for congestion. 01/10/16   Amy Rusty AusLauren Krebs, NP  SUMAtriptan (IMITREX) 50 MG tablet Take 1 tablet (50 mg total) by mouth every 2 (two) hours as needed for migraine. May repeat in 2 hours if headache persists or recurs. 05/17/15   Amy Rusty AusLauren Krebs, NP    Family History Family History  Problem Relation Age of Onset  . Depression Mother   . Cancer Mother 40    skin  . Bipolar disorder Maternal Grandmother   . Psychiatric Illness Maternal Grandmother   . Cancer Maternal Grandmother   . Cancer Maternal Grandfather   . Cancer Paternal Grandmother   . Cancer Paternal Grandfather     Social History Social History  Substance Use Topics  . Smoking  status: Former Smoker    Packs/day: 0.00    Years: 20.00    Types: E-cigarettes    Quit date: 11/09/2015  . Smokeless tobacco: Not on file  . Alcohol use Not on file     Allergies   Review of patient's allergies indicates no known allergies.   Review of Systems Review of Systems  Constitutional: Negative for activity change, chills, fatigue and fever.  HENT: Negative for congestion, sinus pressure and sore throat.   Eyes: Negative for visual disturbance.  Respiratory: Negative for cough, chest tightness and shortness of breath.   Cardiovascular: Negative for chest pain and leg swelling.  Gastrointestinal: Negative for abdominal pain, diarrhea, nausea and vomiting.  Genitourinary: Negative for dysuria.  Musculoskeletal: Negative for arthralgias and gait problem.  Skin: Positive for wound. Negative for rash.  Neurological: Negative for weakness, numbness and headaches.  Hematological: Negative for adenopathy.  Psychiatric/Behavioral: Negative for agitation, behavioral problems and confusion.     Physical Exam Updated Vital Signs BP 122/75 (BP Location: Left Arm)   Pulse 96   Temp 98 F (36.7 C) (Oral)   Resp 16   Ht 5\' 2"  (1.575 m)   Wt 52.2 kg   SpO2 99%   BMI 21.03 kg/m   Physical Exam  Constitutional: She appears well-developed and well-nourished. No distress.  HENT:  Head: Normocephalic and atraumatic.  Eyes: Conjunctivae are normal.  Neck: Neck supple.  Cardiovascular: Normal rate.   Pulmonary/Chest: Effort normal. No respiratory distress.  Abdominal: There is no tenderness.  Musculoskeletal: She exhibits no edema.  Examination of the wrist shows patient has no soft tissue swelling. She has full range of motion. No ecchymosis or hematoma.  Neurological: She is alert.  Skin: Skin is warm and dry.  2 cm laceration to the dorsal aspect of the right wrist. No tendon deficits noted. Laceration linear no sign of foreign body. No sensation loss.  Psychiatric: She  has a normal mood and affect.  Nursing note and vitals reviewed.    ED Treatments / Results  Labs (all labs ordered are listed, but only abnormal results are displayed) Labs Reviewed - No data to display  EKG  EKG Interpretation None       Radiology No results found.  Procedures Procedures (including critical care time) LACERATION REPAIR Performed by: Patience MuscaGAINES, Helios Kohlmann CHRISTOPHER Authorized by: Patience MuscaGAINES, Nishita Isaacks CHRISTOPHER Consent: Verbal consent obtained. Risks and benefits: risks, benefits and alternatives were discussed Consent given by: patient Patient identity confirmed: provided demographic data Prepped and Draped in normal sterile fashion Wound explored  Laceration Location: Right wrist dorsal  Laceration Length: 2 cm  No Foreign Bodies seen or palpated  Anesthesia: local infiltration  Local anesthetic:  lidocaine 1 % with epinephrine  Anesthetic total: 2 ml  Irrigation method: syringe Amount of cleaning: standard  Skin closure: Simple interrupted   Number of sutures: 4-0 nylon, #3  Technique: Simple interrupted  Patient tolerance: Patient tolerated the procedure well with no immediate complications.  SPLINT APPLICATION Date/Time: 12:04 AM Authorized by: Patience MuscaGAINES, Zyron Deeley CHRISTOPHER Consent: Verbal consent obtained. Risks and benefits: risks, benefits and alternatives were discussed Consent given by: patient Splint applied by: ED tech Location details: Right wrist Velcro  Splint type: Prefabricated Velcro  Supplies used: Prefabricated Velcro  Post-procedure: The splinted body part was neurovascularly unchanged following the procedure. Patient tolerance: Patient tolerated the procedure well with no immediate complications.        Medications Ordered in ED Medications  Tdap (BOOSTRIX) injection 0.5 mL (not administered)     Initial Impression / Assessment and Plan / ED Course  I have reviewed the triage vital signs and the nursing  notes.  Pertinent labs & imaging results that were available during my care of the patient were reviewed by me and considered in my medical decision making (see chart for details).  Clinical Course    35 year old female with right dorsal wrist laceration. No tendon deficits or neurological deficits noted. Laceration. #3 4-0 nylon sutures. Dressing applied, Velcro wrist splint applied. She will follow up in 7 days for suture removal and Steri-Strip application.  Final Clinical Impressions(s) / ED Diagnoses   Final diagnoses:  Hand laceration, right, initial encounter    New Prescriptions New Prescriptions   No medications on file     Evon Slackhomas C Urvi Imes, PA-C 05/27/16 0005    Jennye MoccasinBrian S Quigley, MD 05/27/16 0010

## 2016-05-27 ENCOUNTER — Encounter: Payer: Self-pay | Admitting: *Deleted

## 2016-06-02 ENCOUNTER — Emergency Department
Admission: EM | Admit: 2016-06-02 | Discharge: 2016-06-03 | Disposition: A | Discharge status: Home / Self Care | Payer: Medicaid Other | Attending: Student in an Organized Health Care Education/Training Program | Admitting: Student in an Organized Health Care Education/Training Program

## 2016-06-02 ENCOUNTER — Encounter: Payer: Self-pay | Admitting: Emergency Medicine

## 2016-06-02 DIAGNOSIS — X781XXA Intentional self-harm by knife, initial encounter: Secondary | ICD-10-CM | POA: Insufficient documentation

## 2016-06-02 DIAGNOSIS — S61519A Laceration without foreign body of unspecified wrist, initial encounter: Secondary | ICD-10-CM

## 2016-06-02 DIAGNOSIS — Z5181 Encounter for therapeutic drug level monitoring: Secondary | ICD-10-CM | POA: Insufficient documentation

## 2016-06-02 DIAGNOSIS — F1010 Alcohol abuse, uncomplicated: Secondary | ICD-10-CM

## 2016-06-02 DIAGNOSIS — F1994 Other psychoactive substance use, unspecified with psychoactive substance-induced mood disorder: Secondary | ICD-10-CM

## 2016-06-02 DIAGNOSIS — F10920 Alcohol use, unspecified with intoxication, uncomplicated: Secondary | ICD-10-CM | POA: Insufficient documentation

## 2016-06-02 DIAGNOSIS — Z7289 Other problems related to lifestyle: Secondary | ICD-10-CM

## 2016-06-02 DIAGNOSIS — Z7951 Long term (current) use of inhaled steroids: Secondary | ICD-10-CM | POA: Insufficient documentation

## 2016-06-02 DIAGNOSIS — R45851 Suicidal ideations: Secondary | ICD-10-CM | POA: Insufficient documentation

## 2016-06-02 DIAGNOSIS — F329 Major depressive disorder, single episode, unspecified: Secondary | ICD-10-CM | POA: Insufficient documentation

## 2016-06-02 DIAGNOSIS — F4310 Post-traumatic stress disorder, unspecified: Secondary | ICD-10-CM

## 2016-06-02 DIAGNOSIS — Z87891 Personal history of nicotine dependence: Secondary | ICD-10-CM | POA: Insufficient documentation

## 2016-06-02 DIAGNOSIS — J45909 Unspecified asthma, uncomplicated: Secondary | ICD-10-CM | POA: Insufficient documentation

## 2016-06-02 DIAGNOSIS — F32A Depression, unspecified: Secondary | ICD-10-CM

## 2016-06-02 LAB — COMPREHENSIVE METABOLIC PANEL
ALK PHOS: 88 U/L (ref 38–126)
ALT: 17 U/L (ref 14–54)
ANION GAP: 15 (ref 5–15)
AST: 20 U/L (ref 15–41)
Albumin: 4.9 g/dL (ref 3.5–5.0)
BILIRUBIN TOTAL: 0.4 mg/dL (ref 0.3–1.2)
BUN: 18 mg/dL (ref 6–20)
CALCIUM: 8.9 mg/dL (ref 8.9–10.3)
CO2: 17 mmol/L — AB (ref 22–32)
Chloride: 110 mmol/L (ref 101–111)
Creatinine, Ser: 0.54 mg/dL (ref 0.44–1.00)
Glucose, Bld: 88 mg/dL (ref 65–99)
Potassium: 3.5 mmol/L (ref 3.5–5.1)
SODIUM: 142 mmol/L (ref 135–145)
TOTAL PROTEIN: 7.9 g/dL (ref 6.5–8.1)

## 2016-06-02 LAB — CBC
HCT: 44.4 % (ref 35.0–47.0)
Hemoglobin: 15.6 g/dL (ref 12.0–16.0)
MCH: 34.8 pg — ABNORMAL HIGH (ref 26.0–34.0)
MCHC: 35.1 g/dL (ref 32.0–36.0)
MCV: 99.1 fL (ref 80.0–100.0)
PLATELETS: 254 10*3/uL (ref 150–440)
RBC: 4.49 MIL/uL (ref 3.80–5.20)
RDW: 12.8 % (ref 11.5–14.5)
WBC: 8.7 10*3/uL (ref 3.6–11.0)

## 2016-06-02 LAB — URINE DRUG SCREEN, QUALITATIVE (ARMC ONLY)
Amphetamines, Ur Screen: NOT DETECTED
BARBITURATES, UR SCREEN: NOT DETECTED
BENZODIAZEPINE, UR SCRN: NOT DETECTED
CANNABINOID 50 NG, UR ~~LOC~~: NOT DETECTED
Cocaine Metabolite,Ur ~~LOC~~: NOT DETECTED
MDMA (Ecstasy)Ur Screen: NOT DETECTED
METHADONE SCREEN, URINE: NOT DETECTED
Opiate, Ur Screen: NOT DETECTED
Phencyclidine (PCP) Ur S: NOT DETECTED
TRICYCLIC, UR SCREEN: NOT DETECTED

## 2016-06-02 LAB — SALICYLATE LEVEL

## 2016-06-02 LAB — ACETAMINOPHEN LEVEL

## 2016-06-02 LAB — ETHANOL: ALCOHOL ETHYL (B): 293 mg/dL — AB (ref <5)

## 2016-06-02 NOTE — ED Notes (Signed)
Pt has a sitter at bedside. Pt is cooperative and calmed.
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
## 2016-06-03 DIAGNOSIS — F4310 Post-traumatic stress disorder, unspecified: Secondary | ICD-10-CM

## 2016-06-03 DIAGNOSIS — S61519A Laceration without foreign body of unspecified wrist, initial encounter: Secondary | ICD-10-CM

## 2016-06-03 DIAGNOSIS — F1994 Other psychoactive substance use, unspecified with psychoactive substance-induced mood disorder: Secondary | ICD-10-CM | POA: Diagnosis not present

## 2016-06-03 DIAGNOSIS — F1010 Alcohol abuse, uncomplicated: Secondary | ICD-10-CM

## 2016-06-03 LAB — ETHANOL: ALCOHOL ETHYL (B): 114 mg/dL — AB (ref <5)

## 2016-06-03 MED ORDER — NICOTINE 21 MG/24HR TD PT24
21.0000 mg | MEDICATED_PATCH | Freq: Once | TRANSDERMAL | Status: DC
  Administered 2016-06-03: 21 mg via TRANSDERMAL
  Filled 2016-06-03: qty 1
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
## 2016-06-03 NOTE — ED Notes (Signed)
Pt states she is no longer having thoughts of self harm, is willing to contract for safety. States "I do not want to hurt myself, I want to be able to go home in the morning".

## 2016-06-03 NOTE — ED Notes (Signed)
Pt states that she is ready to go home now. Pt was informed that she had to be evaluated by a psychiatrist before she can go home. Pt is AOX4 in no apparent distress cooperative at this time.

## 2016-06-03 NOTE — Discharge Instructions (Signed)
You have been seen in the Emergency Department (ED)  today for a psychiatric complaint.  You have been evaluated by psychiatry and we believe you are safe to be discharged from the hospital.   ° °Please return to the Emergency Department (ED)  immediately if you have ANY thoughts of hurting yourself or anyone else, so that we may help you. ° °Please avoid alcohol and drug use. ° °Follow up with your doctor and/or therapist as soon as possible regarding today's ED  visit.  ° °You may call crisis hotline for [place] at [phone]. ° °
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
## 2016-06-03 NOTE — ED Provider Notes (Signed)
-----------------------------------------   12:33 PM on 06/03/2016 -----------------------------------------   Blood pressure 108/63, pulse 97, temperature 98.4 F (36.9 C), temperature source Oral, resp. rate 18, height 5\' 2"  (1.575 m), weight 113 lb (51.3 kg), last menstrual period 04/29/2016, SpO2 97 %.   Patient cleared by psychiatry for discharge. Labs reviewed. [facility]   Nita Sicklearolina Carisma Troupe, MD 06/03/16 1233

## 2016-06-03 NOTE — ED Notes (Signed)
Pt is alert and oriented this AM. Pt mood is sad and her affect is flat. Pt is somewhat guarded but she is pleasant and cooperative with staff. Pt would like to know when she can go home. Writer discussed tx plan and is awaiting consult with psychiatry. Pt denies SI/HI and AVH. Food and drink provided and 15 minute checks are ongoing for safety.

## 2016-06-03 NOTE — ED Notes (Signed)
Pt. To BHU from ED ambulatory without difficulty, to room  BHU 2. [facility] from [facility]. Pt. Is alert and oriented, warm and dry in no distress. Pt. Denies SI, HI, and AVH. Pt. Calm and cooperative. Pt states she has depression and has never took any medications for, but she is not suicidal she just drank to much today and it was liquor and not a few beers that she usually drinks after work.  Pt. Made aware of security cameras and Q15 minute rounds. Pt. Encouraged to let Nursing staff know of any concerns or needs.

## 2016-06-03 NOTE — ED Notes (Signed)

## 2016-06-03 NOTE — ED Notes (Signed)
ED BHU PLACEMENT JUSTIFICATION Is the patient under IVC or is there intent for IVC: Yes.   Is the patient medically cleared: Yes.   Is there vacancy in the ED BHU: Yes.   Is the population mix appropriate for patient: Yes.   Is the patient awaiting placement in inpatient or outpatient setting: No. Has the patient had a psychiatric consult: No. Survey of unit performed for contraband, proper placement and condition of furniture, tampering with fixtures in bathroom, shower, and each patient room: Yes.  ; Findings: NA APPEARANCE/BEHAVIOR calm, cooperative and adequate rapport can be established NEURO ASSESSMENT Orientation: time, place and person Hallucinations: No.None noted (Hallucinations) Speech: Normal Gait: normal RESPIRATORY ASSESSMENT Normal expansion.  Clear to auscultation.  No rales, rhonchi, or wheezing. CARDIOVASCULAR ASSESSMENT regular rate and rhythm, S1, S2 normal, no murmur, click, rub or gallop GASTROINTESTINAL ASSESSMENT soft, nontender, BS WNL, no r/g EXTREMITIES normal strength, tone, and muscle mass PLAN OF CARE Provide calm/safe environment. Vital signs assessed twice daily. ED BHU Assessment once each 12-hour shift. Collaborate with intake RN daily or as condition indicates. Assure the ED provider has rounded once each shift. Provide and encourage hygiene. Provide redirection as needed. Assess for escalating behavior; address immediately and inform ED provider.  Assess family dynamic and appropriateness for visitation as needed: Yes.  ; If necessary, describe findings: NA Educate the patient/family about BHU procedures/visitation: Yes.  ; If necessary, describe findings: NA  

## 2016-06-03 NOTE — ED Provider Notes (Signed)
-----------------------------------------   7:18 AM on 06/03/2016 -----------------------------------------   Blood pressure 108/63, pulse 97, temperature 98.4 F (36.9 C), temperature source Oral, resp. rate 18, height 5\' 2"  (1.575 m), weight 113 lb (51.3 kg), last menstrual period 04/29/2016, SpO2 97 %.  The patient had no acute events since last update.  Calm and cooperative at this time.  Disposition is pending per Psychiatry/Behavioral Medicine team recommendations.     Irean HongJade J Welton Bord, MD 06/03/16 [phone]

## 2016-08-14 ENCOUNTER — Encounter: Payer: Self-pay | Admitting: Emergency Medicine

## 2016-08-14 ENCOUNTER — Emergency Department: Payer: No Typology Code available for payment source

## 2016-08-14 ENCOUNTER — Emergency Department
Admission: EM | Admit: 2016-08-14 | Discharge: 2016-08-14 | Disposition: A | Discharge status: Home / Self Care | Payer: No Typology Code available for payment source | Attending: Emergency Medicine | Admitting: Emergency Medicine

## 2016-08-14 DIAGNOSIS — S161XXA Strain of muscle, fascia and tendon at neck level, initial encounter: Secondary | ICD-10-CM | POA: Diagnosis not present

## 2016-08-14 DIAGNOSIS — Y999 Unspecified external cause status: Secondary | ICD-10-CM | POA: Diagnosis not present

## 2016-08-14 DIAGNOSIS — Z87891 Personal history of nicotine dependence: Secondary | ICD-10-CM | POA: Insufficient documentation

## 2016-08-14 DIAGNOSIS — Y939 Activity, unspecified: Secondary | ICD-10-CM | POA: Diagnosis not present

## 2016-08-14 DIAGNOSIS — Z791 Long term (current) use of non-steroidal anti-inflammatories (NSAID): Secondary | ICD-10-CM | POA: Diagnosis not present

## 2016-08-14 DIAGNOSIS — J45909 Unspecified asthma, uncomplicated: Secondary | ICD-10-CM | POA: Insufficient documentation

## 2016-08-14 DIAGNOSIS — Y92410 Unspecified street and highway as the place of occurrence of the external cause: Secondary | ICD-10-CM | POA: Insufficient documentation

## 2016-08-14 DIAGNOSIS — Z79899 Other long term (current) drug therapy: Secondary | ICD-10-CM | POA: Insufficient documentation

## 2016-08-14 DIAGNOSIS — M7918 Myalgia, other site: Secondary | ICD-10-CM

## 2016-08-14 DIAGNOSIS — S39012A Strain of muscle, fascia and tendon of lower back, initial encounter: Secondary | ICD-10-CM | POA: Insufficient documentation

## 2016-08-14 DIAGNOSIS — S199XXA Unspecified injury of neck, initial encounter: Secondary | ICD-10-CM | POA: Diagnosis present

## 2016-08-14 MED ORDER — IBUPROFEN 600 MG PO TABS: 600.0000 mg | ORAL_TABLET | Freq: Three times a day (TID) | ORAL | 0 refills | Status: DC | PRN

## 2016-08-14 MED ORDER — CYCLOBENZAPRINE HCL 10 MG PO TABS
10.0000 mg | ORAL_TABLET | Freq: Once | ORAL | Status: AC
  Administered 2016-08-14: 10 mg via ORAL
  Filled 2016-08-14: qty 1

## 2016-08-14 MED ORDER — TRAMADOL HCL 50 MG PO TABS: 50.0000 mg | ORAL_TABLET | Freq: Two times a day (BID) | ORAL | 0 refills | Status: DC | PRN

## 2016-08-14 MED ORDER — CYCLOBENZAPRINE HCL 10 MG PO TABS: 10.0000 mg | ORAL_TABLET | Freq: Three times a day (TID) | ORAL | 0 refills | Status: DC | PRN

## 2016-08-14 MED ORDER — TRAMADOL HCL 50 MG PO TABS
50.0000 mg | ORAL_TABLET | Freq: Once | ORAL | Status: AC
  Administered 2016-08-14: 50 mg via ORAL
  Filled 2016-08-14: qty 1
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
## 2017-09-12 ENCOUNTER — Encounter: Payer: Self-pay | Admitting: Emergency Medicine

## 2017-09-12 ENCOUNTER — Emergency Department: Payer: Medicaid Other

## 2017-09-12 ENCOUNTER — Emergency Department
Admission: EM | Admit: 2017-09-12 | Discharge: 2017-09-12 | Disposition: A | Discharge status: Home / Self Care | Payer: Medicaid Other | Attending: Emergency Medicine | Admitting: Emergency Medicine

## 2017-09-12 DIAGNOSIS — T148XXA Other injury of unspecified body region, initial encounter: Secondary | ICD-10-CM

## 2017-09-12 DIAGNOSIS — Y9389 Activity, other specified: Secondary | ICD-10-CM | POA: Insufficient documentation

## 2017-09-12 DIAGNOSIS — S99922A Unspecified injury of left foot, initial encounter: Secondary | ICD-10-CM

## 2017-09-12 DIAGNOSIS — W2209XA Striking against other stationary object, initial encounter: Secondary | ICD-10-CM | POA: Insufficient documentation

## 2017-09-12 DIAGNOSIS — S0181XA Laceration without foreign body of other part of head, initial encounter: Secondary | ICD-10-CM | POA: Diagnosis present

## 2017-09-12 DIAGNOSIS — F513 Sleepwalking [somnambulism]: Secondary | ICD-10-CM | POA: Diagnosis not present

## 2017-09-12 DIAGNOSIS — Y929 Unspecified place or not applicable: Secondary | ICD-10-CM | POA: Diagnosis not present

## 2017-09-12 DIAGNOSIS — Z87891 Personal history of nicotine dependence: Secondary | ICD-10-CM | POA: Diagnosis not present

## 2017-09-12 DIAGNOSIS — S90412A Abrasion, left great toe, initial encounter: Secondary | ICD-10-CM | POA: Insufficient documentation

## 2017-09-12 DIAGNOSIS — Z79899 Other long term (current) drug therapy: Secondary | ICD-10-CM | POA: Diagnosis not present

## 2017-09-12 DIAGNOSIS — W01198A Fall on same level from slipping, tripping and stumbling with subsequent striking against other object, initial encounter: Secondary | ICD-10-CM | POA: Insufficient documentation

## 2017-09-12 DIAGNOSIS — J4530 Mild persistent asthma, uncomplicated: Secondary | ICD-10-CM | POA: Diagnosis not present

## 2017-09-12 DIAGNOSIS — Y999 Unspecified external cause status: Secondary | ICD-10-CM | POA: Insufficient documentation

## 2017-09-12 MED ORDER — BACITRACIN ZINC 500 UNIT/GM EX OINT
TOPICAL_OINTMENT | Freq: Once | CUTANEOUS | Status: AC
  Administered 2017-09-12: 1 via TOPICAL
  Filled 2017-09-12: qty 0.9

## 2017-09-12 MED ORDER — LIDOCAINE HCL (PF) 1 % IJ SOLN
5.0000 mL | Freq: Once | INTRAMUSCULAR | Status: AC
  Administered 2017-09-12: 5 mL

## 2017-09-12 MED ORDER — LIDOCAINE-EPINEPHRINE-TETRACAINE (LET) SOLUTION
3.0000 mL | Freq: Once | NASAL | Status: AC
  Administered 2017-09-12: 3 mL via TOPICAL
  Filled 2017-09-12: qty 3

## 2017-09-12 MED ORDER — BACITRACIN ZINC 500 UNIT/GM EX OINT
TOPICAL_OINTMENT | CUTANEOUS | Status: AC
  Administered 2017-09-12: 1 via TOPICAL
  Filled 2017-09-12: qty 0.9

## 2017-09-12 MED ORDER — LIDOCAINE HCL (PF) 1 % IJ SOLN
5.0000 mL | Freq: Once | INTRAMUSCULAR | Status: DC
  Filled 2017-09-12: qty 5

## 2017-09-12 NOTE — ED Provider Notes (Signed)
[facility] Emergency Department Provider Note   ____________________________________________   First MD Initiated Contact with Patient 09/12/17 0500     (approximate)  I have reviewed the triage vital signs and the nursing notes.   HISTORY  Chief Complaint Laceration    HPI Kathy Quinn is a 36 y.o. female who presents to the ED from home with a chief complaint of chin laceration and left great toe pain.  Patient reports she was sleepwalking and fell, thinks she struck her chin on a sink.  Presents with laceration under her chin and abrasion to her left great toe.  Denies headache, vision changes, neck pain, chest pain, shortness of breath, abdominal pain, nausea or vomiting.  Tetanus is up-to-date.   Past Medical History:  Diagnosis Date  . Allergy   . Asthma   . Depression   . History of chicken pox   . Knee fracture, left 2009    Patient Active Problem List   Diagnosis Date Noted  . Substance induced mood disorder (HCC) 06/03/2016  . Alcohol abuse 06/03/2016  . PTSD (post-traumatic stress disorder) 06/03/2016  . Self-inflicted laceration of wrist 06/03/2016  . Migraines 05/17/2015  . Anxiety and depression 04/19/2015  . Mild persistent asthma 04/19/2015  . Osteoarthritis of left knee 04/19/2015  . Seasonal allergies 04/19/2015  . Tobacco use 04/19/2015    Past Surgical History:  Procedure Laterality Date  . CESAREAN SECTION  2007  . CHOLECYSTECTOMY    . GALLBLADDER SURGERY  2006  . TUBAL LIGATION  2007    Prior to Admission medications   Medication Sig Start Date End Date Taking? Authorizing Provider  acetaminophen (TYLENOL) 500 MG tablet Take 500 mg by mouth every 6 (six) hours as needed.    [provider]  albuterol (PROVENTIL HFA;VENTOLIN HFA) 108 (90 BASE) MCG/ACT inhaler Inhale 2 puffs into the lungs every 6 (six) hours as needed for wheezing or shortness of breath. Patient not taking: Reported on 06/03/2016  04/19/15   Loura PardonKrebs, Amy Lauren, NP  cetirizine (ZYRTEC) 10 MG tablet Take 1 tablet (10 mg total) by mouth daily. Patient not taking: Reported on 06/03/2016 04/19/15   Loura PardonKrebs, Amy Lauren, NP  cyclobenzaprine (FLEXERIL) 10 MG tablet Take 1 tablet (10 mg total) by mouth 3 (three) times daily as needed. 08/14/16   Joni ReiningSmith, Ronald K, PA-C  dextromethorphan-guaiFENesin [facility](MUCINEX DM) 30-600 MG 12hr tablet Take 1 tablet by mouth 2 (two) times daily. Patient not taking: Reported on 06/03/2016 01/10/16   Loura PardonKrebs, Amy Lauren, NP  diphenhydrAMINE (SOMINEX) 25 MG tablet Take 25 mg by mouth at bedtime as needed for sleep.    [provider]  ferrous sulfate 325 (65 FE) MG tablet Take 325 mg by mouth daily with breakfast.    [provider]  fluticasone (FLONASE) 50 MCG/ACT nasal spray Place 2 sprays into both nostrils daily. Patient not taking: Reported on 06/03/2016 04/19/15   Loura PardonKrebs, Amy Lauren, NP  fluticasone (FLOVENT HFA) 110 MCG/ACT inhaler Inhale 2 puffs into the lungs 2 (two) times daily. Patient not taking: Reported on 06/03/2016 04/19/15   Loura PardonKrebs, Amy Lauren, NP  ibuprofen (ADVIL,MOTRIN) 600 MG tablet Take 1 tablet (600 mg total) by mouth every 8 (eight) hours as needed. 08/14/16   Joni ReiningSmith, Ronald K, PA-C  meloxicam (MOBIC) 15 MG tablet Take 1 tablet (15 mg total) by mouth daily. Patient not taking: Reported on 06/03/2016 04/19/15   Loura PardonKrebs, Amy Lauren, NP  montelukast (SINGULAIR) 10 MG tablet Take 1 tablet (  10 mg total) by mouth at bedtime. Patient not taking: Reported on 06/03/2016 05/17/15   Loura Pardon, NP  ondansetron (ZOFRAN ODT) 4 MG disintegrating tablet Take 1 tablet (4 mg total) by mouth every 6 (six) hours as needed for nausea or vomiting. Patient not taking: Reported on 06/03/2016 03/11/16   Sharyn Creamer, MD  predniSONE (DELTASONE) 20 MG tablet Take 2 tablets (40 mg total) by mouth daily with breakfast. Patient not taking: Reported on 06/03/2016 01/10/16   Loura Pardon, NP  pseudoephedrine  (SUDAFED) 60 MG tablet Take 1 tablet (60 mg total) by mouth every 8 (eight) hours as needed for congestion. Patient not taking: Reported on 06/03/2016 01/10/16   Loura Pardon, NP  SUMAtriptan (IMITREX) 50 MG tablet Take 1 tablet (50 mg total) by mouth every 2 (two) hours as needed for migraine. May repeat in 2 hours if headache persists or recurs. Patient not taking: Reported on 06/03/2016 05/17/15   Loura Pardon, NP  traMADol (ULTRAM) 50 MG tablet Take 1 tablet (50 mg total) by mouth every 12 (twelve) hours as needed. 08/14/16   Joni Reining, PA-C    Allergies Patient has no known allergies.  Family History  Problem Relation Age of Onset  . Depression Mother   . Cancer Mother 40       skin  . Bipolar disorder Maternal Grandmother   . Psychiatric Illness Maternal Grandmother   . Cancer Maternal Grandmother   . Cancer Maternal Grandfather   . Cancer Paternal Grandmother   . Cancer Paternal Grandfather     Social History Social History   Tobacco Use  . Smoking status: Former Smoker    Packs/day: 0.00    Years: 20.00    Pack years: 0.00    Types: E-cigarettes    Last attempt to quit: 11/09/2015    Years since quitting: 1.8  . Smokeless tobacco: Never Used  Substance Use Topics  . Alcohol use: Yes    Comment: occ  . Drug use: No    Review of Systems  Constitutional: No fever/chills.  Eyes: No visual changes. ENT: Positive for chin laceration.  No sore throat. Cardiovascular: Denies chest pain. Respiratory: Denies shortness of breath. Gastrointestinal: No abdominal pain.  No nausea, no vomiting.  No diarrhea.  No constipation. Genitourinary: Negative for dysuria. Musculoskeletal: Positive for left great toe pain.  Negative for back pain. Skin: Negative for rash. Neurological: Negative for headaches, focal weakness or numbness.   ____________________________________________   PHYSICAL EXAM:  VITAL SIGNS: ED Triage Vitals [09/12/17 0445]  Enc Vitals Group       BP 118/78     Pulse Rate 97     Resp 18     Temp 98.5 F (36.9 C)     Temp Source Oral     SpO2 99 %     Weight 130 lb (59 kg)     Height 5\' 2"  (1.575 m)     Head Circumference      Peak Flow      Pain Score 5     Pain Loc      Pain Edu?      Excl. in GC?     Constitutional: Alert and oriented. Well appearing and in no acute distress. Eyes: Conjunctivae are normal. PERRL. EOMI. Head: Approximately 1.5 cm superficial and linear laceration beneath right chin; bleeding controlled. Nose: No congestion/rhinnorhea. Mouth/Throat: Mucous membranes are moist.  Oropharynx non-erythematous. Neck: No stridor.  No cervical spine tenderness to  palpation. Cardiovascular: Normal rate, regular rhythm. Grossly normal heart sounds.  Good peripheral circulation. Respiratory: Normal respiratory effort.  No retractions. Lungs CTAB. Gastrointestinal: Soft and nontender. No distention. No abdominal bruits. No CVA tenderness. Musculoskeletal: Tip of left great toe with abrasion.  2+ distal pulses.  Brisk, less than 5-second capillary refill.  Able to wiggle toe without difficulty or distress.  No joint effusions. Neurologic:  Normal speech and language. No gross focal neurologic deficits are appreciated. No gait instability. Skin:  Skin is warm, dry and intact. No rash noted. Psychiatric: Mood and affect are normal. Speech and behavior are normal.  ____________________________________________   LABS (all labs ordered are listed, but only abnormal results are displayed)  Labs Reviewed - No data to display ____________________________________________  EKG  None ____________________________________________  RADIOLOGY  No results found.  ____________________________________________   PROCEDURES  Procedure(s) performed:     Marland KitchenMarland KitchenLaceration Repair Date/Time: 09/12/2017 5:35 AM Performed by: Irean Hong, MD Authorized by: Irean Hong, MD   Consent:    Consent obtained:  Verbal    Consent given by:  Patient   Risks discussed:  Infection, pain, poor cosmetic result and poor wound healing   Alternatives discussed:  No treatment Anesthesia (see MAR for exact dosages):    Anesthesia method:  Local infiltration   Local anesthetic:  Lidocaine 1% w/o epi Laceration details:    Location:  Face   Face location:  Chin   Length (cm):  1.5 Repair type:    Repair type:  Simple Pre-procedure details:    Preparation:  Patient was prepped and draped in usual sterile fashion Exploration:    Hemostasis achieved with:  Direct pressure and LET   Wound exploration: entire depth of wound probed and visualized     Contaminated: no   Treatment:    Area cleansed with:  Saline and Betadine   Amount of cleaning:  Standard   Irrigation solution:  Sterile saline   Irrigation method:  Syringe   Visualized foreign bodies/material removed: no   Skin repair:    Repair method:  Sutures   Suture size:  6-0   Suture material:  Prolene   Suture technique:  Running Approximation:    Approximation:  Close Post-procedure details:    Dressing:  Sterile dressing and antibiotic ointment   Patient tolerance of procedure:  Tolerated well, no immediate complications    Critical Care performed: No  ____________________________________________   INITIAL IMPRESSION / ASSESSMENT AND PLAN / ED COURSE  As part of my medical decision making, I reviewed the following data within the electronic MEDICAL RECORD NUMBER History obtained from family, Radiograph reviewed and Notes from prior ED visits.   36 year old female who presents with chin laceration and left toe injury status post fall while sleepwalking.  Patient is alert, oriented and neurologically intact without focal deficits.  Cervical spine is nontender without step-offs or deformities.  Patient tolerated suture repair well.  Will image left toe.  Clinical Course as of Sep 13 543  Sun Sep 12, 2017  4098 Patient decided she did not want x-ray  after all.  Will cancel.  Will place bacitracin and bandage the toe.  Strict return precautions given.  Patient and family members verbalized understanding and agree with plan of care.  [JS]    Clinical Course User Index [JS] Irean Hong, MD     ____________________________________________   FINAL CLINICAL IMPRESSION(S) / ED DIAGNOSES  Final diagnoses:  Facial laceration, initial encounter  Abrasion  Toe  injury, left, initial encounter     ED Discharge Orders    None       Note:  This document was prepared using Dragon voice recognition software and may include unintentional dictation errors.    Irean HongSung, Susi Goslin J, MD 09/12/17 [phone]

## 2017-09-12 NOTE — Discharge Instructions (Signed)
Suture removal in 5 days.  Return to the ER for worsening symptoms, increased redness/swelling, purulent discharge or other concerns. 

## 2017-09-12 NOTE — ED Notes (Signed)

## 2017-09-12 NOTE — ED Triage Notes (Signed)
Patient states that she was sleep walking and fell. Patient states that she hit her chin on a sink. Patient with laceration under chin. Dressing applied.

## 2017-10-13 ENCOUNTER — Emergency Department
Admission: EM | Admit: 2017-10-13 | Discharge: 2017-10-13 | Disposition: A | Discharge status: Home / Self Care | Payer: Medicaid Other | Attending: Emergency Medicine | Admitting: Emergency Medicine

## 2017-10-13 ENCOUNTER — Emergency Department: Payer: Medicaid Other

## 2017-10-13 ENCOUNTER — Other Ambulatory Visit: Payer: Self-pay

## 2017-10-13 DIAGNOSIS — Z5321 Procedure and treatment not carried out due to patient leaving prior to being seen by health care provider: Secondary | ICD-10-CM | POA: Diagnosis not present

## 2017-10-13 DIAGNOSIS — R0789 Other chest pain: Secondary | ICD-10-CM | POA: Insufficient documentation

## 2017-10-13 NOTE — ED Triage Notes (Signed)
Pt was wrestling yesterday and she fell, now co left rib pain worse when she moves or takes a deep breath.

## 2018-06-03 ENCOUNTER — Encounter: Payer: Self-pay | Admitting: Nurse Practitioner

## 2018-06-03 ENCOUNTER — Ambulatory Visit: Payer: Medicaid Other | Admitting: Nurse Practitioner

## 2018-06-03 ENCOUNTER — Other Ambulatory Visit: Payer: Self-pay

## 2018-06-03 VITALS — BP 112/72 | HR 81 | Temp 98.2°F | Ht 62.5 in | Wt 114.8 lb

## 2018-06-03 DIAGNOSIS — F5104 Psychophysiologic insomnia: Secondary | ICD-10-CM | POA: Diagnosis not present

## 2018-06-03 DIAGNOSIS — F331 Major depressive disorder, recurrent, moderate: Secondary | ICD-10-CM | POA: Diagnosis not present

## 2018-06-03 DIAGNOSIS — F419 Anxiety disorder, unspecified: Secondary | ICD-10-CM | POA: Diagnosis not present

## 2018-06-03 MED ORDER — BUPROPION HCL ER (XL) 150 MG PO TB24: 150.0000 mg | ORAL_TABLET | Freq: Every day | ORAL | 5 refills | Status: AC

## 2018-06-03 MED ORDER — HYDROXYZINE HCL 25 MG PO TABS: 25.0000 mg | ORAL_TABLET | Freq: Three times a day (TID) | ORAL | 0 refills | Status: AC | PRN
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
## 2018-06-03 NOTE — Patient Instructions (Addendum)
Kathy BatheKaren A Ziska,   Thank you for coming in to clinic today.  1. START wellbutrin XL 150 mg once daily.  Take 1 tablet by mouth.  2. START hydroxyzine 25 mg tablet.  Take 1/2 - 1 tablet (12.5-25 mg) up to three times daily as needed for anxiety or insomnia.  3. 4-7-8 breathing technique: breathe in to count of 4, hold breath for count of 7, exhale for count of 8; do 3-5 times for letting go of overactive thoughts   [facility]) [place] [address], [place], [place] [address] Phone: [phone]  [facility], available walk-in 9am-4pm M-F [address] [place], [place] [address] Hours: 9am - 4pm (M-F, walk in available) [phone]  PSYCH COUNSELING ONLY Self Referral: 1. [facility]   Address: [address], [place], [place] [address] Hours: Open today  9AM-7PM Phone: [phone]  2. [facility] Barr[facility]yl [facility] [facility]  - [facility] Address: [address], [place], [place] [address] Phone: [phone]   Please schedule a follow-up appointment with [name], AGNP. Return in about 6 weeks (around 07/15/2018) for anxiety, depression.  If you have any other questions or concerns, please feel free to call the clinic or send a message through MyChart. You may also schedule an earlier appointment if necessary.  You will receive a survey after today's visit either digitally by e-mail or paper by [employer] mail. Your experiences and feedback matter to us.  Please respond so we know how we are doing as we provide care for you.   Wilhelmina McardleLauren [name], DNP, AGNP-BC Adult Gerontology Nurse Practitioner [facility], [facility]

## 2018-06-06 ENCOUNTER — Encounter: Payer: Self-pay | Admitting: Nurse Practitioner

## 2018-07-15 ENCOUNTER — Ambulatory Visit: Payer: Self-pay | Admitting: Nurse Practitioner
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
